# Patient Record
Sex: Female | Born: 1950 | Race: Black or African American | Hispanic: No | Marital: Married | Smoking: Never smoker
Health system: Southern US, Community
[De-identification: ages and names within clinical notes are randomized; demographics above are authoritative.]

## PROBLEM LIST (undated history)

## (undated) DIAGNOSIS — E785 Hyperlipidemia, unspecified: Secondary | ICD-10-CM

## (undated) DIAGNOSIS — B958 Unspecified staphylococcus as the cause of diseases classified elsewhere: Secondary | ICD-10-CM

## (undated) DIAGNOSIS — M7730 Calcaneal spur, unspecified foot: Secondary | ICD-10-CM

## (undated) DIAGNOSIS — D219 Benign neoplasm of connective and other soft tissue, unspecified: Secondary | ICD-10-CM

## (undated) HISTORY — DX: Benign neoplasm of connective and other soft tissue, unspecified: D21.9

## (undated) HISTORY — PX: HERNIA REPAIR: SHX51

## (undated) HISTORY — PX: EYE MUSCLE SURGERY: SHX370

## (undated) HISTORY — DX: Unspecified staphylococcus as the cause of diseases classified elsewhere: B95.8

## (undated) HISTORY — DX: Hyperlipidemia, unspecified: E78.5

## (undated) HISTORY — DX: Calcaneal spur, unspecified foot: M77.30

---

## 1984-06-17 HISTORY — PX: MYOMECTOMY ABDOMINAL APPROACH: SUR870

## 1994-06-17 HISTORY — PX: ABDOMINAL HYSTERECTOMY: SHX81

## 2000-02-28 ENCOUNTER — Other Ambulatory Visit: Admission: RE | Admit: 2000-02-28 | Discharge: 2000-02-28 | Payer: Self-pay | Admitting: Obstetrics and Gynecology

## 2001-04-16 ENCOUNTER — Other Ambulatory Visit: Admission: RE | Admit: 2001-04-16 | Discharge: 2001-04-16 | Payer: Self-pay | Admitting: Obstetrics and Gynecology

## 2002-09-28 ENCOUNTER — Other Ambulatory Visit: Admission: RE | Admit: 2002-09-28 | Discharge: 2002-09-28 | Payer: Self-pay | Admitting: Obstetrics and Gynecology

## 2004-06-15 ENCOUNTER — Other Ambulatory Visit: Admission: RE | Admit: 2004-06-15 | Discharge: 2004-06-15 | Payer: Self-pay | Admitting: Obstetrics and Gynecology

## 2005-08-16 ENCOUNTER — Other Ambulatory Visit: Admission: RE | Admit: 2005-08-16 | Discharge: 2005-08-16 | Payer: Self-pay | Admitting: Obstetrics and Gynecology

## 2005-08-29 LAB — HM COLONOSCOPY

## 2006-03-14 ENCOUNTER — Ambulatory Visit: Payer: Self-pay | Admitting: Family Medicine

## 2006-06-27 ENCOUNTER — Ambulatory Visit: Payer: Self-pay | Admitting: Family Medicine

## 2007-03-18 ENCOUNTER — Ambulatory Visit: Payer: Self-pay | Admitting: Family Medicine

## 2007-03-24 ENCOUNTER — Ambulatory Visit: Payer: Self-pay | Admitting: Family Medicine

## 2007-04-06 ENCOUNTER — Ambulatory Visit: Payer: Self-pay | Admitting: Family Medicine

## 2007-05-06 ENCOUNTER — Ambulatory Visit: Payer: Self-pay | Admitting: Family Medicine

## 2007-06-04 ENCOUNTER — Ambulatory Visit: Payer: Self-pay | Admitting: Family Medicine

## 2007-07-02 ENCOUNTER — Ambulatory Visit: Payer: Self-pay | Admitting: Family Medicine

## 2007-10-15 ENCOUNTER — Other Ambulatory Visit: Admission: RE | Admit: 2007-10-15 | Discharge: 2007-10-15 | Payer: Self-pay | Admitting: Addiction Medicine

## 2008-08-16 ENCOUNTER — Ambulatory Visit: Payer: Self-pay | Admitting: Obstetrics and Gynecology

## 2008-12-01 ENCOUNTER — Ambulatory Visit: Payer: Self-pay | Admitting: Family Medicine

## 2008-12-13 ENCOUNTER — Ambulatory Visit: Payer: Self-pay | Admitting: Obstetrics and Gynecology

## 2008-12-13 ENCOUNTER — Other Ambulatory Visit: Admission: RE | Admit: 2008-12-13 | Discharge: 2008-12-13 | Payer: Self-pay | Admitting: Obstetrics and Gynecology

## 2008-12-13 ENCOUNTER — Encounter: Payer: Self-pay | Admitting: Obstetrics and Gynecology

## 2009-01-09 ENCOUNTER — Ambulatory Visit: Payer: Self-pay | Admitting: Obstetrics and Gynecology

## 2009-07-27 ENCOUNTER — Ambulatory Visit: Payer: Self-pay | Admitting: Obstetrics and Gynecology

## 2009-09-19 ENCOUNTER — Ambulatory Visit: Payer: Self-pay | Admitting: Family Medicine

## 2009-11-17 ENCOUNTER — Ambulatory Visit: Payer: Self-pay | Admitting: Family Medicine

## 2009-12-22 ENCOUNTER — Ambulatory Visit: Payer: Self-pay | Admitting: Obstetrics and Gynecology

## 2009-12-22 ENCOUNTER — Other Ambulatory Visit: Admission: RE | Admit: 2009-12-22 | Discharge: 2009-12-22 | Payer: Self-pay | Admitting: Obstetrics and Gynecology

## 2009-12-25 LAB — HM PAP SMEAR: HM PAP: NEGATIVE

## 2010-01-29 ENCOUNTER — Ambulatory Visit: Payer: Self-pay | Admitting: Obstetrics and Gynecology

## 2010-06-17 DIAGNOSIS — B958 Unspecified staphylococcus as the cause of diseases classified elsewhere: Secondary | ICD-10-CM

## 2010-06-17 HISTORY — DX: Unspecified staphylococcus as the cause of diseases classified elsewhere: B95.8

## 2010-06-17 HISTORY — PX: OTHER SURGICAL HISTORY: SHX169

## 2010-07-30 ENCOUNTER — Other Ambulatory Visit: Payer: Self-pay

## 2010-07-30 DIAGNOSIS — E7800 Pure hypercholesterolemia, unspecified: Secondary | ICD-10-CM

## 2010-07-30 DIAGNOSIS — R7309 Other abnormal glucose: Secondary | ICD-10-CM

## 2010-11-22 ENCOUNTER — Ambulatory Visit: Payer: BC Managed Care – PPO | Admitting: Family Medicine

## 2010-12-03 ENCOUNTER — Ambulatory Visit (INDEPENDENT_AMBULATORY_CARE_PROVIDER_SITE_OTHER): Payer: BC Managed Care – PPO | Admitting: Family Medicine

## 2010-12-03 ENCOUNTER — Encounter: Payer: Self-pay | Admitting: Family Medicine

## 2010-12-03 DIAGNOSIS — R7301 Impaired fasting glucose: Secondary | ICD-10-CM | POA: Insufficient documentation

## 2010-12-03 DIAGNOSIS — Z79899 Other long term (current) drug therapy: Secondary | ICD-10-CM

## 2010-12-03 DIAGNOSIS — L509 Urticaria, unspecified: Secondary | ICD-10-CM

## 2010-12-03 DIAGNOSIS — L299 Pruritus, unspecified: Secondary | ICD-10-CM

## 2010-12-03 DIAGNOSIS — I10 Essential (primary) hypertension: Secondary | ICD-10-CM | POA: Insufficient documentation

## 2010-12-03 DIAGNOSIS — E7800 Pure hypercholesterolemia, unspecified: Secondary | ICD-10-CM

## 2010-12-03 LAB — COMPREHENSIVE METABOLIC PANEL
Albumin: 4 g/dL (ref 3.5–5.2)
BUN: 23 mg/dL (ref 6–23)
Calcium: 9.3 mg/dL (ref 8.4–10.5)
Chloride: 104 mEq/L (ref 96–112)
Creat: 0.85 mg/dL (ref 0.50–1.10)
Glucose, Bld: 87 mg/dL (ref 70–99)
Potassium: 4 mEq/L (ref 3.5–5.3)

## 2010-12-03 LAB — LIPID PANEL
HDL: 41 mg/dL (ref >39)
Total CHOL/HDL Ratio: 4.9 Ratio
Triglycerides: 56 mg/dL (ref <150)

## 2010-12-03 NOTE — Patient Instructions (Signed)
ZYRTEC (CETIRIZINE) 10MG  IS THE ANTIHISTAMINE THAT I RECOMMEND YOU TAKE.  TAKE IT ONCE DAILY--IF IT MAKES YOU SLEEPY, THEN TAKE IT AT BEDTIME. IF YOU FIND THAT YOU ARE STILL DROWSY FROM IT, THEN CHANGE TO CLARITIN.  YOU MAY STILL USE DIPHENHYDRAMINE (BENADRYL) IF NEEDED TO TREAT HIVES.  Avoid taking long, hot baths or showers, as this can dry out yours skin and contribute to itching.  Moisturize your skin regularly, drink plenty of water.  Consider using Sarna anti-itch lotion, or topical hydrocortisone creams, if needed for itching.  Hives (Urticaria) Hives (urticaria) are itchy, red, swollen patches on the skin. They may change size, shape, and location quickly and repeatedly. Hives that occur deeper in the skin can cause swelling of the hands, feet, and face. Hives may be an allergic reaction to something you or your child ate, touched, or put on the skin. Hives can also be a reaction to cold, heat, viral infections, medication, insect bites, or emotional stress. Often the cause is hard to find. Hives can come and go for several days to several weeks. Hives are not contagious. HOME CARE INSTRUCTIONS:  If the cause of the hives is known, avoid exposure to that source.   To relieve itching and rash:   Apply cold compresses to the skin or take cool water baths. Do not take or give your child hot baths or showers because the warmth will make the itching worse.   The best medicine for hives is an antihistamine. An antihistamine will not cure hives, but it will reduce their severity. You can use an antihistamine available over the counter, such as Benadryl. This medicine may make your child sleepy. Teenagers should not drive while using this medicine.   Take or give Benadryl every 6 hours until the hives are completely gone for 24 hours or as directed.   Your child may have other medications prescribed for itching. Give these as directed by your child's physician.   You or your child should wear  loose fitting clothing, including undergarments. Skin irritations may make hives worse.   Follow-up as provided by your caregiver.  SEEK MEDICAL CARE IF:  You or your child still have considerable itching after taking the medication (prescribed or purchased over the counter).   An oral temperature above 101 develops.   Joint swelling or pain occurs.  SEEK IMMEDIATE MEDICAL CARE IF:  Swollen lips or tongue are noticed.   There is difficulty with breathing, swallowing, or tightness in the throat or chest.   Abdominal (belly) pain develops.   Your child starts acting very sick.  These may be the first signs of a life-threatening allergic reaction. THIS IS AN EMERGENCY. Call 911 for medical help. MAKE SURE YOU:   Understand these instructions.   Will watch your condition.   Will get help right away if you are not doing well or get worse.  Document Released: 06/03/2005 Document Re-Released: 05/16/2008 [facility] Patient Information 2011 [place], [place].

## 2010-12-03 NOTE — Progress Notes (Signed)
Subjective:    Patient ID: Angel Ortega, female    DOB: 09-Sep-1950, 60 y.o.   MRN: [id number]  HPI Patient presents with complaints of itching.  Itchy "everywhere"--legs, elbows, arms.  Doesn't feel itchy all the time, but suddenly, something will feel itchy (ie a foot), and then she starts scratching, then develops welps and bumps, and continues to be itchy.  Symptoms have been going on for about 4 months.  Not getting worse, but not getting better.  Denies any change in products (soaps, etc.).  Tried stopping the fish oil for a week, but it didn't improve (hasn't re-started yet).  Denies any change in diet.  Takes long hot baths.  Has a rash under her left breast that comes and goes, slightly itchy, but worse once she starts scratching it.    Occasionally drinks regular sodas.  Has been being monitored by Dr. Eda Paschal for elevated sugars, but wasn't told she had diabetes.  Review of chart shows h/o elevated sugars (in impaired fasting glucose range).  Past Medical History  Diagnosis Date  . Hypertension   . Impaired fasting glucose     monitored by Dr. Eda Paschal  . Hyperlipidemia   . Allergy     Past Surgical History  Procedure Date  . Abdominal hysterectomy 1996    partial; for fibroids  . Eye muscle surgery child    L eye  . Hernia repair     History   Social History  . Marital Status: Married    Spouse Name: N/A    Number of Children: N/A  . Years of Education: N/A   Occupational History  . Not on file.   Social History Main Topics  . Smoking status: Never Smoker   . Smokeless tobacco: Never Used  . Alcohol Use: No  . Drug Use: No  . Sexually Active: Not on file   Other Topics Concern  . Not on file   Social History Narrative  . No narrative on file    Family History  Problem Relation Age of Onset  . Cancer Mother     lung cancer  . Diabetes Father   . Hypertension Father   . Diabetes Sister   . Cancer Brother     ?tumors in muscles  .  Hyperlipidemia Sister   . Hypertension Sister   . Hyperlipidemia Sister   . Hypertension Sister     Current outpatient prescriptions:aspirin 81 MG tablet, Take 81 mg by mouth 3 (three) times a week.  , Disp: , Rfl: ;  diphenhydrAMINE (BENADRYL) 25 MG tablet, Take 25 mg by mouth at bedtime.  , Disp: , Rfl: ;  lisinopril-hydrochlorothiazide (PRINZIDE,ZESTORETIC) 20-25 MG per tablet, Take 1 tablet by mouth daily.  , Disp: , Rfl: ;  fish oil-omega-3 fatty acids 1000 MG capsule, Take 1 g by mouth daily.  , Disp: , Rfl:   No Known Allergies  Review of Systems Denies fevers, cough, shortness of breath.  Occasional sneeze.  No chest pain, palpitations, or headaches.  Denies dizziness, swelling in feet.  Denies urinary complaints or bowel changes    Objective:   Physical Exam BP 132/82  Pulse 72  Ht 5\' 3"  (1.6 m)  Wt 200 lb (90.719 kg)  BMI 35.43 kg/m2 Pleasant female, scratching at inner thighs, in no distress HEENT: PERRL, EOMI, conjunctiva clear.  OP clear, normal lips/mouth Neck:  No thyromegaly or lymphadenopathy Heart: regular rate and rhythm Lungs: clear Skin:  Below skin fold under left breast is  a hyperpigmented area.  No raised/warmth/erythematous or any central clearing.  Not macerated.  Skin is mildly dry.  A few urticarial lesions noted (and warm to the touch) where she has recently been scratching (inner thighs). No dermatographism noted Extremities:  Trace edema Psych:  Normal mood, affect, hygiene and grooming        Assessment & Plan:   1. Urticaria  TSH  2. Pruritic disorder  TSH  3. Essential hypertension, benign  Comprehensive metabolic panel   controlled  4. Encounter for long-term (current) use of other medications  Comprehensive metabolic panel  5. Impaired fasting glucose  Hemoglobin A1c  6. Pure hypercholesterolemia  Lipid panel    Unclear etiology of pruritis--check labs.  May partially be related to dry skin.  Stop bubble baths.  Recommended Zyrtec daily,  and benadryl prn.  Discussed moisturizers and Sarna prn, also OTC hydrocortisone creams prn to focal areas (ie. Under breast)

## 2010-12-04 ENCOUNTER — Telehealth: Payer: Self-pay | Admitting: *Deleted

## 2010-12-04 NOTE — Telephone Encounter (Signed)
Left message for pt to call back to go over lab results.

## 2010-12-04 NOTE — Telephone Encounter (Signed)
Spoke with patient, she was given lab results. She stated that she will call back and get an OV to discuss results further with Dr.Lalonde.

## 2010-12-04 NOTE — Telephone Encounter (Signed)
Pt called and asked if I would fax her lab results to her GYN, Dr.Gottsegen. I faxed records to [phone]

## 2011-04-04 ENCOUNTER — Ambulatory Visit (INDEPENDENT_AMBULATORY_CARE_PROVIDER_SITE_OTHER): Payer: BC Managed Care – PPO | Admitting: Gynecology

## 2011-04-04 DIAGNOSIS — E7800 Pure hypercholesterolemia, unspecified: Secondary | ICD-10-CM

## 2011-04-04 DIAGNOSIS — R7309 Other abnormal glucose: Secondary | ICD-10-CM

## 2011-04-04 DIAGNOSIS — R739 Hyperglycemia, unspecified: Secondary | ICD-10-CM

## 2011-05-27 ENCOUNTER — Encounter: Payer: Self-pay | Admitting: Family Medicine

## 2011-05-27 ENCOUNTER — Ambulatory Visit (INDEPENDENT_AMBULATORY_CARE_PROVIDER_SITE_OTHER): Payer: BC Managed Care – PPO | Admitting: Family Medicine

## 2011-05-27 VITALS — BP 122/80 | HR 84 | Temp 98.2°F | Ht 63.0 in | Wt 193.0 lb

## 2011-05-27 DIAGNOSIS — L0291 Cutaneous abscess, unspecified: Secondary | ICD-10-CM

## 2011-05-27 DIAGNOSIS — L0390 Cellulitis, unspecified: Secondary | ICD-10-CM

## 2011-05-27 MED ORDER — DOXYCYCLINE HYCLATE 100 MG PO TABS: 100.0000 mg | ORAL_TABLET | Freq: Two times a day (BID) | ORAL | Status: DC

## 2011-05-27 NOTE — Progress Notes (Signed)
Patient presents with complaint of a boil on her right inner thigh.  First noticed the bump 4-5 days ago.  It got larger and more tender, and after soaking in a hot tub with epsom salts it ended up bursting.  It continues to drain --notices some Harsha with light red blood on her panties.  Continues to sting, but overall is less painful than prior to it draining.  Denies fevers.  Past Medical History  Diagnosis Date  . Hypertension   . Impaired fasting glucose     monitored by Dr. Eda Paschal  . Hyperlipidemia   . Allergy     Past Surgical History  Procedure Date  . Abdominal hysterectomy 1996    partial; for fibroids  . Eye muscle surgery child    L eye  . Hernia repair     History   Social History  . Marital Status: Married    Spouse Name: N/A    Number of Children: N/A  . Years of Education: N/A   Occupational History  . Not on file.   Social History Main Topics  . Smoking status: Never Smoker   . Smokeless tobacco: Never Used  . Alcohol Use: No  . Drug Use: No  . Sexually Active: Not on file   Other Topics Concern  . Not on file   Social History Narrative  . No narrative on file    Family History  Problem Relation Age of Onset  . Cancer Mother     lung cancer  . Diabetes Father   . Hypertension Father   . Diabetes Sister   . Cancer Brother     ?tumors in muscles  . Hyperlipidemia Sister   . Hypertension Sister   . Hyperlipidemia Sister   . Hypertension Sister    Current Outpatient Prescriptions on File Prior to Visit  Medication Sig Dispense Refill  . aspirin 81 MG tablet Take 81 mg by mouth 3 (three) times a week.        Marland Kitchen lisinopril-hydrochlorothiazide (PRINZIDE,ZESTORETIC) 20-25 MG per tablet Take 1 tablet by mouth daily.         No Known Allergies  ROS: Denies nausea, vomiting, other skin lesions or rashes (just pruritis on her arms, dry skin).  Denies fevers, URI symptoms, cough  PHYSICAL EXAM: BP 122/80  Pulse 84  Temp(Src) 98.2 F (36.8  C) (Oral)  Ht 5\' 3"  (1.6 m)  Wt 193 lb (87.544 kg)  BMI 34.19 kg/m2 Well developed, pleasant female in no distress R inner thigh: 9 cm indurated lesions right inner thigh with 3 different areas that have drained.  No significant active draining.  +hyperpigmentation of her dark skin extending beyond the indurated area  Procedure: Area was cleansed with betadine, anesthetized with 1% lidocaine with epi.  An 11 blade was then used to make incision over somewhat fluctuant portion of mass.  Large amounts of purulent material, some of which was thick and cheesy (ie. Sebaceous-appearing) was expressed from the wound.  Was packed lightly with iodiform gauze to keep wound edges open to allow for continued drainage.  Patient tolerated the procedure well  ASSESSMENT/PLAN: 1. Abscess  doxycycline (VIBRA-TABS) 100 MG tablet, Wound culture, PR Drain skin abscess, simple  Wound care instructions reviewed.  Hope to have gauze remain in for 1-2 days.  Once removed, restart warm soaks.  Take doxycycline as prescribed.  Wound culture sent.   F/u next week, sooner prn, for re-check

## 2011-05-27 NOTE — Patient Instructions (Signed)
Leave packing gauze in for 1-2 days, then you may remove it.  Continue to apply warm compresses/soaks.  Take antibiotics as prescribed.  Follow up in 1 week for re-check, sooner if needed   Abscess Care After An abscess (also called a boil or furuncle) is an infected area that contains a collection of pus. Signs and symptoms of an abscess include pain, tenderness, redness, or hardness, or you may feel a moveable soft area under your skin. An abscess can occur anywhere in the body. The infection may spread to surrounding tissues causing cellulitis. A cut (incision) by the surgeon was made over your abscess and the pus was drained out. Gauze may have been packed into the space to provide a drain that will allow the cavity to heal from the inside outwards. The boil may be painful for 5 to 7 days. Most people with a boil do not have high fevers. Your abscess, if seen early, may not have localized, and may not have been lanced. If not, another appointment may be required for this if it does not get better on its own or with medications. HOME CARE INSTRUCTIONS   Only take over-the-counter or prescription medicines for pain, discomfort, or fever as directed by your caregiver.   When you bathe, soak and then remove gauze or iodoform packs at least daily or as directed by your caregiver. You may then wash the wound gently with mild soapy water. Repack with gauze or do as your caregiver directs.  SEEK IMMEDIATE MEDICAL CARE IF:   You develop increased pain, swelling, redness, drainage, or bleeding in the wound site.   You develop signs of generalized infection including muscle aches, chills, fever, or a general ill feeling.   An oral temperature above 102 F (38.9 C) develops, not controlled by medication.  See your caregiver for a recheck if you develop any of the symptoms described above. If medications (antibiotics) were prescribed, take them as directed. Document Released: 12/20/2004 Document Revised:  02/13/2011 Document Reviewed: 08/17/2007 [facility] Patient Information 2012 [place], [place].

## 2011-05-30 LAB — WOUND CULTURE: Gram Stain: NONE SEEN

## 2011-06-05 ENCOUNTER — Ambulatory Visit (INDEPENDENT_AMBULATORY_CARE_PROVIDER_SITE_OTHER): Payer: BC Managed Care – PPO | Admitting: Family Medicine

## 2011-06-05 ENCOUNTER — Encounter: Payer: Self-pay | Admitting: Family Medicine

## 2011-06-05 VITALS — BP 130/74 | HR 72 | Ht 63.0 in | Wt 198.0 lb

## 2011-06-05 DIAGNOSIS — I10 Essential (primary) hypertension: Secondary | ICD-10-CM

## 2011-06-05 DIAGNOSIS — R7301 Impaired fasting glucose: Secondary | ICD-10-CM

## 2011-06-05 DIAGNOSIS — A4902 Methicillin resistant Staphylococcus aureus infection, unspecified site: Secondary | ICD-10-CM

## 2011-06-05 DIAGNOSIS — E7800 Pure hypercholesterolemia, unspecified: Secondary | ICD-10-CM

## 2011-06-05 MED ORDER — LISINOPRIL-HYDROCHLOROTHIAZIDE 20-25 MG PO TABS: 1.0000 | ORAL_TABLET | Freq: Every day | ORAL | Status: DC

## 2011-06-05 NOTE — Progress Notes (Signed)
Patient presents for f/u on abscess on R upper thigh, I&D's last week.  Culture grew MRSA, and sensitive to doxycycline.  She reports it is much better, no longer draining.  Swelling is much decreased, no fevers.  Has some itching of the skin on her hands, arms which is relieved by taking Zyrtec.  Denies any rashes  Requesting refill to [facility] of blood pressure medication.  Denies headaches, dizziness.  BP's at home run 120-130/75-80  Past Medical History  Diagnosis Date  . Hypertension   . Impaired fasting glucose     monitored by Dr. Eda Paschal  . Hyperlipidemia   . Allergy     Past Surgical History  Procedure Date  . Abdominal hysterectomy 1996    partial; for fibroids  . Eye muscle surgery child    L eye  . Hernia repair     History   Social History  . Marital Status: Married    Spouse Name: N/A    Number of Children: N/A  . Years of Education: N/A   Occupational History  . Not on file.   Social History Main Topics  . Smoking status: Never Smoker   . Smokeless tobacco: Never Used  . Alcohol Use: No  . Drug Use: No  . Sexually Active: Not on file   Other Topics Concern  . Not on file   Social History Narrative  . No narrative on file    Family History  Problem Relation Age of Onset  . Cancer Mother     lung cancer  . Diabetes Father   . Hypertension Father   . Diabetes Sister   . Cancer Brother     ?tumors in muscles  . Hyperlipidemia Sister   . Hypertension Sister   . Hyperlipidemia Sister   . Hypertension Sister    Current Outpatient Prescriptions on File Prior to Visit  Medication Sig Dispense Refill  . aspirin 81 MG tablet Take 81 mg by mouth 3 (three) times a week.        . cetirizine (ZYRTEC) 10 MG tablet Take 10 mg by mouth daily.        Marland Kitchen doxycycline (VIBRA-TABS) 100 MG tablet Take 1 tablet (100 mg total) by mouth 2 (two) times daily.  20 tablet  0   No Known Allergies  ROS:  Denies fevers, nausea, vomiting, diarrhea, new skin complaints,  dizziness, headaches, chest pain, edema, or other concerns.  PHYSICAL EXAM: BP 130/74  Pulse 72  Ht 5\' 3"  (1.6 m)  Wt 198 lb (89.812 kg)  BMI 35.07 kg/m2 Well developed, pleasant, obese female in no distress  R inner thigh-- still hyperpigmented, but no warmth.  No longer any induration.  There are two open wounds.  The inferiormost was where the I&D was last week.  Opening measures 3/4 cm, no active drainage or surrounding erythema.  Superior/medial lesion measures 1.5 cm, [place] center, no surrounding erythema. No drainage Extremities: no edema Heart: regular rate and rhythm Psych: normal mood, affect, hygiene and grooming Neuro: grossly normal cranial nerves, normal gait  ASSESSMENT/PLAN:  1. MRSA infection     resolved--complete ABX  2. Essential hypertension, benign  lisinopril-hydrochlorothiazide (PRINZIDE,ZESTORETIC) 20-25 MG per tablet, Comprehensive metabolic panel  3. Pure hypercholesterolemia  Lipid panel, Comprehensive metabolic panel  4. Impaired fasting glucose  Hemoglobin A1c    MRSA--improving s/p I&D and doxycycline.  Complete course of antibiotics.  Continue warm soapy soaks until skin is entirely healed HTN--well controlled.  Refill meds  Hyperlipidemia--results from  October from Dr. Eda Paschal was reviewed.  Low HDL (38), LDL 123, ratio 4.8.  Discussed daily exercise, and re-starting fish oil 3000-4000 mg daily.  Continue low cholesterol diet.  nonfasting sugar was 108

## 2011-06-05 NOTE — Patient Instructions (Addendum)
MRSA--improving after draining and doxycycline.  Complete course of antibiotics.  Continue warm soapy soaks until skin is entirely healed; also use antibacterial ointment to two open areas of skin until healed.  Return if increasing swelling, pain, drainage, fever, etc for re-evaluation   Community-Associated MRSA CA-MRSA stands for community-associated methicillin-resistant Staphylococcus aureus. MRSA is a type of bacteria that is resistant to some common antibiotics. It can cause infections in the skin and many other places in the body. Staphylococcus aureus, often called "staph," is a bacteria that normally lives on the skin or in the nose. Staph on the surface of the skin or in the nose does not cause problems. However, if the staph enters the body through a cut, wound, or break in the skin, an infection can happen. Up until recently, infections with the MRSA type of staph mainly occurred in hospitals and other healthcare settings. There are now increasing problems with MRSA infections in the community as well. Infections with MRSA may be very serious or even life-threatening. CA-MRSA is becoming more common. It is known to spread in crowded settings, in jails and prisons, and in situations where there is close skin-to-skin contact, such as during sporting events or in locker rooms. MRSA can be spread through shared items, such as children's toys, razors, towels, or sports equipment.  CAUSES All staph, including MRSA, are normally harmless unless they enter the body through a scratch, cut, or wound, such as with surgery. All staph, including MRSA, can be spread from person-to-person by touching contaminated objects or through direct contact.  MRSA now causes illness in people who have not been in hospitals or other healthcare facilities. Cases of MRSA diseases in the community have been associated with:   Recent antibiotic use.   Sharing contaminated towels or clothes.   Having active skin  diseases.   Participating in contact sports.   Living in crowded settings.   Intravenous (IV) drug use.   Community-associated MRSA infections are usually skin infections, but may cause other severe illnesses.   Staph bacteria are one of the most common causes of skin infection. However, they are also a common cause of pneumonia, bone or joint infections, and bloodstream infections.  DIAGNOSIS Diagnosis of MRSA is done by cultures of fluid samples that may come from:  Swabs taken from cuts or wounds in infected areas.   Nasal swabs.   Saliva or deep cough specimens from the lungs (sputum).   Urine.   Blood.  Many people are "colonized" with MRSA but have no signs of infection. This means that people carry the MRSA germ on their skin or in their nose and may never develop MRSA infection.  TREATMENT  Treatment varies and is based on how serious, how deep, or how extensive the infection is. For example:  Some skin infections, such as a small boil or abscess, may be treated by draining yellowish-Armbrust fluid (pus) from the site of the infection.   Deeper or more widespread soft tissue infections are usually treated with surgery to drain pus and with antibiotic medicine given by vein or by mouth. This may be recommended even if you are pregnant.   Serious infections may require a hospital stay.  If antibiotics are given, they may be needed for several weeks. PREVENTION Because many people are colonized with staph, including MRSA, preventing the spread of the bacteria from person-to-person is most important. The best way to prevent the spread of bacteria and other germs is through proper hand washing or  by using alcohol-based hand disinfectants. The following are other ways to help prevent MRSA infection within community settings.   Wash your hands frequently with soap and water for at least 15 seconds. Otherwise, use alcohol-based hand disinfectants when soap and water is not available.    Make sure people who live with you wash their hands often, too.   Do not share personal items. For example, avoid sharing razors and other personal hygiene items, towels, clothing, and athletic equipment.   Wash and dry your clothes and bedding at the warmest temperatures recommended on the labels.   Keep wounds covered. Pus from infected sores may contain MRSA and other bacteria. Keep cuts and abrasions clean and covered with germ-free (sterile), dry bandages until they are healed.   If you have a wound that appears infected, ask your caregiver if a culture for MRSA and other bacteria should be done.   If you are breastfeeding, talk to your caregiver about MRSA. You may be asked to temporarily stop breastfeeding.  HOME CARE INSTRUCTIONS   Take your antibiotics as directed. Finish them even if you start to feel better.   Avoid close contact with those around you as much as possible. Do not use towels, razors, toothbrushes, bedding, or other items that will be used by others.   To fight the infection, follow your caregiver's instructions for wound care. Wash your hands before and after changing your bandages.   If you have an intravascular device, such as a catheter, make sure you know how to care for it.   Be sure to tell any healthcare providers that you have MRSA so they are aware of your infection.  SEEK IMMEDIATE MEDICAL CARE IF:  The infection appears to be getting worse. Signs include:   Increased warmth, redness, or tenderness around the wound site.   A red line that extends from the infection site.   A dark color in the area around the infection.   Wound drainage that is tan, yellow, or green.   A bad smell coming from the wound.   You feel sick to your stomach (nauseous) and throw up (vomit) or cannot keep medicine down.   You have a fever.   Your baby is older than 3 months with a rectal temperature of 102 F (38.9 C) or higher.   Your baby is 33 months old or  younger with a rectal temperature of 100.4 F (38 C) or higher.   You have difficulty breathing.  MAKE SURE YOU:   Understand these instructions.   Will watch your condition.   Will get help right away if you are not doing well or get worse.  Document Released: 09/06/2005 Document Revised: 02/13/2011 Document Reviewed: 09/06/2010 [facility] Patient Information 2012 [place], [place].  Please re-start fish oil capsules (goal of 3000-4000 mg daily). Continue to exercise daily and follow low cholesterol diet

## 2011-07-22 ENCOUNTER — Encounter: Payer: Self-pay | Admitting: Obstetrics and Gynecology

## 2011-07-22 ENCOUNTER — Encounter: Payer: Self-pay | Admitting: Internal Medicine

## 2011-07-22 LAB — HM MAMMOGRAPHY: HM Mammogram: NEGATIVE

## 2011-08-13 DIAGNOSIS — M7730 Calcaneal spur, unspecified foot: Secondary | ICD-10-CM | POA: Insufficient documentation

## 2011-08-13 DIAGNOSIS — D219 Benign neoplasm of connective and other soft tissue, unspecified: Secondary | ICD-10-CM | POA: Insufficient documentation

## 2011-08-22 ENCOUNTER — Ambulatory Visit (INDEPENDENT_AMBULATORY_CARE_PROVIDER_SITE_OTHER): Payer: BC Managed Care – PPO | Admitting: Obstetrics and Gynecology

## 2011-08-22 ENCOUNTER — Encounter: Payer: Self-pay | Admitting: Obstetrics and Gynecology

## 2011-08-22 VITALS — BP 138/80 | Ht 63.0 in | Wt 204.0 lb

## 2011-08-22 DIAGNOSIS — Z01419 Encounter for gynecological examination (general) (routine) without abnormal findings: Secondary | ICD-10-CM

## 2011-08-22 NOTE — Progress Notes (Signed)
Patient came to see me today for her annual GYN exam. She is doing well without hot flashes. She does have a normal mammogram. She is now do her lab through her PCP. There also watching her hemoglobin A1c. She is watching her diet. We will recheck it in June. She has had a normal bone density. She is having no vaginal bleeding. She is having no pelvic pain. She gets periodic right shoulder pain without limitation of motion or sensory changes. Her PCP has her on Tylenol arthritis.  HEENT: Within normal limits. Kennon Portela present. Neck: No masses. Supraclavicular lymph nodes: Not enlarged. Breasts: Examined in both sitting and lying position. Symmetrical without skin changes or masses. Abdomen: Soft no masses guarding or rebound. No hernias. Pelvic: External within normal limits. BUS within normal limits. Vaginal examination shows good estrogen effect, no cystocele enterocele or rectocele. Cervix and uterus absent. Adnexa within normal limits. Rectovaginal confirmatory. Extremities within normal limits.

## 2011-08-23 LAB — URINALYSIS W MICROSCOPIC + REFLEX CULTURE
Glucose, UA: NEGATIVE mg/dL
Hgb urine dipstick: NEGATIVE
Nitrite: NEGATIVE
Protein, ur: NEGATIVE mg/dL

## 2011-08-24 LAB — URINE CULTURE: Colony Count: 10000

## 2011-12-18 ENCOUNTER — Telehealth: Payer: Self-pay | Admitting: Family Medicine

## 2011-12-18 ENCOUNTER — Other Ambulatory Visit: Payer: Self-pay | Admitting: *Deleted

## 2011-12-18 DIAGNOSIS — I10 Essential (primary) hypertension: Secondary | ICD-10-CM

## 2011-12-18 MED ORDER — LISINOPRIL-HYDROCHLOROTHIAZIDE 20-25 MG PO TABS: 1.0000 | ORAL_TABLET | Freq: Every day | ORAL | Status: DC

## 2011-12-18 NOTE — Telephone Encounter (Signed)
Called patient and left message for her to return my call and schedule med check with labs prior with Dr.Knapp (was due ~12/04/11) before I can refill her meds.

## 2011-12-18 NOTE — Telephone Encounter (Signed)
Done

## 2011-12-24 ENCOUNTER — Encounter: Payer: Self-pay | Admitting: Family Medicine

## 2011-12-24 ENCOUNTER — Ambulatory Visit (INDEPENDENT_AMBULATORY_CARE_PROVIDER_SITE_OTHER): Payer: BC Managed Care – PPO | Admitting: Family Medicine

## 2011-12-24 VITALS — BP 130/78 | HR 91 | Wt 205.0 lb

## 2011-12-24 DIAGNOSIS — I10 Essential (primary) hypertension: Secondary | ICD-10-CM

## 2011-12-24 DIAGNOSIS — Z79899 Other long term (current) drug therapy: Secondary | ICD-10-CM

## 2011-12-24 DIAGNOSIS — R7301 Impaired fasting glucose: Secondary | ICD-10-CM

## 2011-12-24 DIAGNOSIS — J309 Allergic rhinitis, unspecified: Secondary | ICD-10-CM

## 2011-12-24 DIAGNOSIS — E669 Obesity, unspecified: Secondary | ICD-10-CM | POA: Insufficient documentation

## 2011-12-24 DIAGNOSIS — J302 Other seasonal allergic rhinitis: Secondary | ICD-10-CM | POA: Insufficient documentation

## 2011-12-24 LAB — COMPREHENSIVE METABOLIC PANEL
ALT: 13 U/L (ref 0–35)
AST: 13 U/L (ref 0–37)
CO2: 30 mEq/L (ref 19–32)
Calcium: 9.2 mg/dL (ref 8.4–10.5)
Chloride: 104 mEq/L (ref 96–112)
Creat: 0.84 mg/dL (ref 0.50–1.10)
Potassium: 4 mEq/L (ref 3.5–5.3)
Sodium: 143 mEq/L (ref 135–145)
Total Protein: 6.5 g/dL (ref 6.0–8.3)

## 2011-12-24 LAB — CBC WITH DIFFERENTIAL/PLATELET
Basophils Absolute: 0 10*3/uL (ref 0.0–0.1)
Lymphocytes Relative: 36 % (ref 12–46)
Lymphs Abs: 1.5 10*3/uL (ref 0.7–4.0)
MCV: 87.6 fL (ref 78.0–100.0)
Neutro Abs: 2.2 10*3/uL (ref 1.7–7.7)
Neutrophils Relative %: 53 % (ref 43–77)
Platelets: 227 10*3/uL (ref 150–400)
RBC: 4.03 MIL/uL (ref 3.87–5.11)
RDW: 14.6 % (ref 11.5–15.5)
WBC: 4.2 10*3/uL (ref 4.0–10.5)

## 2011-12-24 LAB — LIPID PANEL
Cholesterol: 197 mg/dL (ref 0–200)
Total CHOL/HDL Ratio: 5.8 Ratio

## 2011-12-24 NOTE — Progress Notes (Signed)
  Subjective:    Patient ID: STARLYNN KLINKNER, female    DOB: 03-19-1951, 61 y.o.   MRN: [id number]  HPI She is here for recheck. She continues on medications listed in the chart and is having no difficulty with them. She does have a history of impaired fasting glucose. Her exercise is quite minimal. She blames this on some recent knee pain. It was difficult to get a good history from her but on she complains of pain with motion but states that at this time it is getting better. Her allergies are under good control.   Review of Systems     Objective:   Physical Exam alert and in no distress. Tympanic membranes and canals are normal. Throat is clear. Tonsils are normal. Neck is supple without adenopathy or thyromegaly. Cardiac exam shows a regular sinus rhythm without murmurs or gallops. Lungs are clear to auscultation.        Assessment & Plan:   1. Essential hypertension, benign  CBC with Differential  2. Impaired fasting glucose  Comprehensive metabolic panel  3. Obesity (BMI 30-39.9)  Lipid panel  4. Encounter for long-term (current) use of other medications  CBC with Differential, Comprehensive metabolic panel, Lipid panel  5. Allergic rhinitis, seasonal     encouraged her to become more physically active. No particular therapy for the knee since this is getting better. Encouraged her to make dietary changes. She will return here as needed.

## 2012-01-09 ENCOUNTER — Ambulatory Visit (INDEPENDENT_AMBULATORY_CARE_PROVIDER_SITE_OTHER): Payer: BC Managed Care – PPO | Admitting: Family Medicine

## 2012-01-09 ENCOUNTER — Encounter: Payer: Self-pay | Admitting: Family Medicine

## 2012-01-09 VITALS — BP 130/82 | HR 72 | Temp 97.6°F | Ht 63.0 in | Wt 202.0 lb

## 2012-01-09 DIAGNOSIS — A4902 Methicillin resistant Staphylococcus aureus infection, unspecified site: Secondary | ICD-10-CM

## 2012-01-09 MED ORDER — DOXYCYCLINE HYCLATE 100 MG PO TABS: 100.0000 mg | ORAL_TABLET | Freq: Two times a day (BID) | ORAL | Status: AC

## 2012-01-09 MED ORDER — MUPIROCIN 2 % EX OINT: TOPICAL_OINTMENT | CUTANEOUS | Status: DC

## 2012-01-09 NOTE — Progress Notes (Signed)
Chief Complaint  Patient presents with  . Rash    has had MRSA in the past, has noticed some bumps and would like you to check them out and see if they could possibly be MRSA. Various places-left thigh, around belly button and back of right thigh near buttock. Also wonders if she could just be having a reaction to fish oil?   HPI: About a week ago she started noticing some sore bumps--2 on her left thigh, 1 on her R buttock, 1 near her umbilicus.  She also had one near her vagina that burst last week.  Lump resolved, but some residual swelling.  Denies any fevers. H/o large abscess in November that required I&D, that cultured positive for MRSA. Has had no recurrent problems until recently.  Denies any contacts with skin conditions or MRSA.  Past Medical History  Diagnosis Date  . Hypertension   . Impaired fasting glucose     monitored by Dr. Eda Paschal  . Hyperlipidemia   . Allergy   . Fibroid   . Heel spur   . Staph infection 2012   Past Surgical History  Procedure Date  . Eye muscle surgery child    L eye  . Hernia repair   . Abdominal hysterectomy 1996    TAH/FIBROIDS  . Myomectomy abdominal approach 1986  . Staph infection 2012    Right thigh   History   Social History  . Marital Status: Married    Spouse Name: N/A    Number of Children: N/A  . Years of Education: N/A   Occupational History  . Not on file.   Social History Main Topics  . Smoking status: Never Smoker   . Smokeless tobacco: Never Used  . Alcohol Use: No  . Drug Use: No  . Sexually Active: Yes    Birth Control/ Protection: Surgical   Other Topics Concern  . Not on file   Social History Narrative  . No narrative on file   Current Outpatient Prescriptions on File Prior to Visit  Medication Sig Dispense Refill  . Acetaminophen (TYLENOL ARTHRITIS PAIN PO) Take by mouth.      Marland Kitchen aspirin 81 MG tablet Take 81 mg by mouth 3 (three) times a week.        . cetirizine (ZYRTEC) 10 MG tablet Take 10 mg by  mouth daily.        Marland Kitchen lisinopril-hydrochlorothiazide (PRINZIDE,ZESTORETIC) 20-25 MG per tablet Take 1 tablet by mouth daily.  90 tablet  0  . Omega-3 Fatty Acids (FISH OIL PO) Take 2 capsules by mouth daily.       Recently also started taking a MVI with iron.  No Known Allergies  ROS: Denies fevers, nausea, vomiting, headaches, dizziness. Sometimes also gets a rash between her thigh, gets dark, sometimes itchy.  PHYSICAL EXAM: BP 130/82  Pulse 72  Temp 97.6 F (36.4 C) (Oral)  Ht 5\' 3"  (1.6 m)  Wt 202 lb (91.627 kg)  BMI 35.78 kg/m2 Well developed, pleasant female in no distress Right thigh--very small abscess proximally (about 1cm in size, no fluctuance, pustule present).  Just below this is a healing lesion--dried/flaky, less tender.  Similar lesions on her posterior thigh and above umbilicus (although that one appears slightly ulcerated)   ASSESSMENT/PLAN: 1. MRSA (methicillin resistant Staphylococcus aureus)  doxycycline (VIBRA-TABS) 100 MG tablet, mupirocin ointment (BACTROBAN) 2 %   Recurrent MRSA.  Treat with doxycycline, but will also treat as a presumed carrier, given recurrence and extent of infection  now.  Will treat with nasal mupirocin to anterior nares BID x 5-7 days, and a one-time application of chlorhexidine.  May use mupirocin in future prn for early recurrences.

## 2012-01-09 NOTE — Patient Instructions (Addendum)
The bumps you have appear consistent with recurrent MRSA.  Given the number that you have, and that this isn't the first time, it is likely that you are a carrier.  We are going to treat you as a carrier (carrying the bacteria in your nose).  Use chlorhexidine topically (2-4%) applied as a body wash from neck down, as a single application (use this just once).  Use the mupirocin ointment to the front part of the nostrils twice daily for 5-7 days.    Take the antibiotic twice daily as directed.  You may use the mupirocin three times daily, in the future, if you get any of these "lumps" coming back, as this might treat it effectively and avoid it turning into a larger abscess or needing oral antibiotics.  Apply warm compresses to the lesion on your thigh--this likely will drain a little pus in the next few days.  Community-Associated MRSA CA-MRSA stands for community-associated methicillin-resistant Staphylococcus aureus. MRSA is a type of bacteria that is resistant to some common antibiotics. It can cause infections in the skin and many other places in the body. Staphylococcus aureus, often called "staph," is a bacteria that normally lives on the skin or in the nose. Staph on the surface of the skin or in the nose does not cause problems. However, if the staph enters the body through a cut, wound, or break in the skin, an infection can happen. Up until recently, infections with the MRSA type of staph mainly occurred in hospitals and other healthcare settings. There are now increasing problems with MRSA infections in the community as well. Infections with MRSA may be very serious or even life-threatening. CA-MRSA is becoming more common. It is known to spread in crowded settings, in jails and prisons, and in situations where there is close skin-to-skin contact, such as during sporting events or in locker rooms. MRSA can be spread through shared items, such as children's toys, razors, towels, or sports  equipment.  CAUSES All staph, including MRSA, are normally harmless unless they enter the body through a scratch, cut, or wound, such as with surgery. All staph, including MRSA, can be spread from person-to-person by touching contaminated objects or through direct contact.  MRSA now causes illness in people who have not been in hospitals or other healthcare facilities. Cases of MRSA diseases in the community have been associated with:   Recent antibiotic use.   Sharing contaminated towels or clothes.   Having active skin diseases.   Participating in contact sports.   Living in crowded settings.   Intravenous (IV) drug use.   Community-associated MRSA infections are usually skin infections, but may cause other severe illnesses.   Staph bacteria are one of the most common causes of skin infection. However, they are also a common cause of pneumonia, bone or joint infections, and bloodstream infections.  DIAGNOSIS Diagnosis of MRSA is done by cultures of fluid samples that may come from:  Swabs taken from cuts or wounds in infected areas.   Nasal swabs.   Saliva or deep cough specimens from the lungs (sputum).   Urine.   Blood.  Many people are "colonized" with MRSA but have no signs of infection. This means that people carry the MRSA germ on their skin or in their nose and may never develop MRSA infection.  TREATMENT  Treatment varies and is based on how serious, how deep, or how extensive the infection is. For example:  Some skin infections, such as a small boil or  abscess, may be treated by draining yellowish-Aslinger fluid (pus) from the site of the infection.   Deeper or more widespread soft tissue infections are usually treated with surgery to drain pus and with antibiotic medicine given by vein or by mouth. This may be recommended even if you are pregnant.   Serious infections may require a hospital stay.  If antibiotics are given, they may be needed for several  weeks. PREVENTION Because many people are colonized with staph, including MRSA, preventing the spread of the bacteria from person-to-person is most important. The best way to prevent the spread of bacteria and other germs is through proper hand washing or by using alcohol-based hand disinfectants. The following are other ways to help prevent MRSA infection within community settings.   Wash your hands frequently with soap and water for at least 15 seconds. Otherwise, use alcohol-based hand disinfectants when soap and water is not available.   Make sure people who live with you wash their hands often, too.   Do not share personal items. For example, avoid sharing razors and other personal hygiene items, towels, clothing, and athletic equipment.   Wash and dry your clothes and bedding at the warmest temperatures recommended on the labels.   Keep wounds covered. Pus from infected sores may contain MRSA and other bacteria. Keep cuts and abrasions clean and covered with germ-free (sterile), dry bandages until they are healed.   If you have a wound that appears infected, ask your caregiver if a culture for MRSA and other bacteria should be done.   If you are breastfeeding, talk to your caregiver about MRSA. You may be asked to temporarily stop breastfeeding.  HOME CARE INSTRUCTIONS   Take your antibiotics as directed. Finish them even if you start to feel better.   Avoid close contact with those around you as much as possible. Do not use towels, razors, toothbrushes, bedding, or other items that will be used by others.   To fight the infection, follow your caregiver's instructions for wound care. Wash your hands before and after changing your bandages.   If you have an intravascular device, such as a catheter, make sure you know how to care for it.   Be sure to tell any healthcare providers that you have MRSA so they are aware of your infection.  SEEK IMMEDIATE MEDICAL CARE IF:  The infection  appears to be getting worse. Signs include:   Increased warmth, redness, or tenderness around the wound site.   A red line that extends from the infection site.   A dark color in the area around the infection.   Wound drainage that is tan, yellow, or green.   A bad smell coming from the wound.   You feel sick to your stomach (nauseous) and throw up (vomit) or cannot keep medicine down.   You have a fever.   Your baby is older than 3 months with a rectal temperature of 102 F (38.9 C) or higher.   Your baby is 39 months old or younger with a rectal temperature of 100.4 F (38 C) or higher.   You have difficulty breathing.  MAKE SURE YOU:   Understand these instructions.   Will watch your condition.   Will get help right away if you are not doing well or get worse.  Document Released: 09/06/2005 Document Revised: 05/23/2011 Document Reviewed: 09/06/2010 [facility] Patient Information 2012 [place], [place].

## 2012-03-09 ENCOUNTER — Telehealth: Payer: Self-pay | Admitting: Family Medicine

## 2012-03-09 ENCOUNTER — Other Ambulatory Visit: Payer: Self-pay | Admitting: Medical

## 2012-03-09 DIAGNOSIS — I10 Essential (primary) hypertension: Secondary | ICD-10-CM

## 2012-03-09 MED ORDER — LISINOPRIL-HYDROCHLOROTHIAZIDE 20-25 MG PO TABS: 1.0000 | ORAL_TABLET | Freq: Every day | ORAL | Status: DC

## 2012-03-10 NOTE — Telephone Encounter (Signed)
Done

## 2012-09-01 ENCOUNTER — Telehealth: Payer: Self-pay | Admitting: Family Medicine

## 2012-09-01 NOTE — Telephone Encounter (Signed)
CALLED PT TO INFORM HER SHE NEEDS APPOINTMENT WE CAN MAKE REFERRAL BUT SHE NEEDS TO COME IN PER JCL

## 2012-09-08 ENCOUNTER — Telehealth: Payer: Self-pay | Admitting: Family Medicine

## 2012-09-08 DIAGNOSIS — I10 Essential (primary) hypertension: Secondary | ICD-10-CM

## 2012-09-08 MED ORDER — LISINOPRIL-HYDROCHLOROTHIAZIDE 20-25 MG PO TABS: 1.0000 | ORAL_TABLET | Freq: Every day | ORAL | Status: DC

## 2012-09-08 NOTE — Telephone Encounter (Signed)
SENT MED IN 

## 2012-09-10 ENCOUNTER — Encounter: Payer: Self-pay | Admitting: Women's Health

## 2012-09-10 ENCOUNTER — Encounter: Payer: Self-pay | Admitting: Obstetrics and Gynecology

## 2012-09-10 ENCOUNTER — Ambulatory Visit (INDEPENDENT_AMBULATORY_CARE_PROVIDER_SITE_OTHER): Payer: BC Managed Care – PPO | Admitting: Women's Health

## 2012-09-10 VITALS — BP 128/86 | Ht 63.0 in | Wt 208.0 lb

## 2012-09-10 DIAGNOSIS — Z01419 Encounter for gynecological examination (general) (routine) without abnormal findings: Secondary | ICD-10-CM

## 2012-09-10 DIAGNOSIS — Z780 Asymptomatic menopausal state: Secondary | ICD-10-CM

## 2012-09-10 NOTE — Patient Instructions (Signed)

## 2012-09-10 NOTE — Progress Notes (Signed)
Angel Ortega 12/02/1950 [id number]    History:    The patient presents for annual exam.  TAH in 58 for fibroids, myomectomy 84. History of normal mammograms and Paps. Normal bone density in 2010 T score AP spine 1.5, bilateral hip average 1.1. Hypertension-labs and meds primary care. 1 benign polyp with colonoscopy in 2012.   Past medical history, past surgical history, family history and social history were all reviewed and documented in the EPIC chart. Retired Production designer, theatre/television/film. History of MRSA. Father, sisters diabetes and hypertension   ROS:  A  ROS was performed and pertinent positives and negatives are included in the history.  Exam:  Filed Vitals:   09/10/12 0948  BP: 128/86    General appearance:  Normal Head/Neck:  Normal, without cervical or supraclavicular adenopathy. Thyroid:  Symmetrical, normal in size, without palpable masses or nodularity. Respiratory  Effort:  Normal  Auscultation:  Clear without wheezing or rhonchi Cardiovascular  Auscultation:  Regular rate, without rubs, murmurs or gallops  Edema/varicosities:  Not grossly evident Abdominal  Soft,nontender, without masses, guarding or rebound.  Liver/spleen:  No organomegaly noted  Hernia:  None appreciated  Skin  Inspection:  Grossly normal  Palpation:  Grossly normal Neurologic/psychiatric  Orientation:  Normal with appropriate conversation.  Mood/affect:  Normal  Genitourinary    Breasts: Examined lying and sitting.     Right: Without masses, retractions, discharge or axillary adenopathy.     Left: Without masses, retractions, discharge or axillary adenopathy.   Inguinal/mons:  Normal without inguinal adenopathy  External genitalia:  Normal  BUS/Urethra/Skene's glands:  Normal  Bladder:  Normal  Vagina:  Normal  Cervix:  absent Uterus: absent  Adnexa/parametria:     Rt: Without masses or tenderness.   Lt: Without masses or tenderness.  Anus and perineum: Normal  Digital rectal  exam: Normal sphincter tone without palpated masses or tenderness  Assessment/Plan:  62 y.o. MBF G0  for annual exam with no complaints.  TAH/fibroids-normal Pap history Normal DEXA 2011 Benign polyp 2012 Hypertension/hypercholesterolemia-primary care labs and meds  Plan: Home Hemoccult card given with instructions. SBE's, continue annual mammogram, calcium rich diet, vitamin D 2000 daily encouraged. Reviewed importance of increasing regular exercise and decreasing calories for weight loss. Pap screening guidelines reviewed. Repeat DEXA this year.    Harrington Challenger WHNP, 11:10 AM 09/10/2012

## 2012-09-18 ENCOUNTER — Encounter: Payer: Self-pay | Admitting: Internal Medicine

## 2012-10-01 ENCOUNTER — Other Ambulatory Visit: Payer: Self-pay | Admitting: Anesthesiology

## 2012-10-01 DIAGNOSIS — Z1211 Encounter for screening for malignant neoplasm of colon: Secondary | ICD-10-CM

## 2012-12-16 ENCOUNTER — Telehealth: Payer: Self-pay | Admitting: Family Medicine

## 2012-12-16 DIAGNOSIS — I10 Essential (primary) hypertension: Secondary | ICD-10-CM

## 2012-12-16 MED ORDER — LISINOPRIL-HYDROCHLOROTHIAZIDE 20-25 MG PO TABS: 1.0000 | ORAL_TABLET | Freq: Every day | ORAL | Status: DC

## 2012-12-16 NOTE — Telephone Encounter (Signed)
PT NEEDS REFILL ON BP MEDS SENT TO [facility]. PT WAS INFORMED THAT IT HAD BEEN A WHILE SINCE A WELL VISIT AND PT DECLINED TO MAKE ONE AND ASKED THAT THIS REQUESTED BE SENT BACK.

## 2012-12-16 NOTE — Telephone Encounter (Signed)
Called patient and left message informing patient that she is due for med check appt with Dr.Lalonde, last was 12/24/11. I did let her know that I would call in this rx but I need her to call and schedule med check ASAP.

## 2012-12-29 ENCOUNTER — Ambulatory Visit (INDEPENDENT_AMBULATORY_CARE_PROVIDER_SITE_OTHER): Payer: BC Managed Care – PPO | Admitting: Family Medicine

## 2012-12-29 ENCOUNTER — Encounter: Payer: Self-pay | Admitting: Family Medicine

## 2012-12-29 VITALS — BP 114/76 | HR 70 | Wt 208.0 lb

## 2012-12-29 DIAGNOSIS — R7301 Impaired fasting glucose: Secondary | ICD-10-CM

## 2012-12-29 DIAGNOSIS — J309 Allergic rhinitis, unspecified: Secondary | ICD-10-CM

## 2012-12-29 DIAGNOSIS — I10 Essential (primary) hypertension: Secondary | ICD-10-CM

## 2012-12-29 DIAGNOSIS — J302 Other seasonal allergic rhinitis: Secondary | ICD-10-CM

## 2012-12-29 DIAGNOSIS — Z79899 Other long term (current) drug therapy: Secondary | ICD-10-CM

## 2012-12-29 DIAGNOSIS — E669 Obesity, unspecified: Secondary | ICD-10-CM

## 2012-12-29 LAB — COMPREHENSIVE METABOLIC PANEL
ALT: 16 U/L (ref 0–35)
BUN: 29 mg/dL — ABNORMAL HIGH (ref 6–23)
CO2: 28 mEq/L (ref 19–32)
Calcium: 9.4 mg/dL (ref 8.4–10.5)
Chloride: 106 mEq/L (ref 96–112)
Creat: 0.96 mg/dL (ref 0.50–1.10)
Total Bilirubin: 0.7 mg/dL (ref 0.3–1.2)

## 2012-12-29 LAB — CBC WITH DIFFERENTIAL/PLATELET
Eosinophils Absolute: 0.2 10*3/uL (ref 0.0–0.7)
Eosinophils Relative: 5 % (ref 0–5)
HCT: 37.1 % (ref 36.0–46.0)
Lymphs Abs: 1.5 10*3/uL (ref 0.7–4.0)
MCH: 28.7 pg (ref 26.0–34.0)
MCV: 86.7 fL (ref 78.0–100.0)
Monocytes Absolute: 0.4 10*3/uL (ref 0.1–1.0)
Platelets: 201 10*3/uL (ref 150–400)
RBC: 4.28 MIL/uL (ref 3.87–5.11)
RDW: 14.4 % (ref 11.5–15.5)

## 2012-12-29 LAB — LIPID PANEL
Cholesterol: 201 mg/dL — ABNORMAL HIGH (ref 0–200)
HDL: 35 mg/dL — ABNORMAL LOW (ref >39)
Total CHOL/HDL Ratio: 5.7 Ratio
Triglycerides: 84 mg/dL (ref <150)
VLDL: 17 mg/dL (ref 0–40)

## 2012-12-29 MED ORDER — LISINOPRIL-HYDROCHLOROTHIAZIDE 20-25 MG PO TABS: 1.0000 | ORAL_TABLET | Freq: Every day | ORAL | Status: DC

## 2012-12-29 NOTE — Progress Notes (Signed)
  Subjective:    Patient ID: Angel Ortega, female    DOB: 1950-09-07, 62 y.o.   MRN: [id number]  HPI She is here for medication check. She continues on her blood pressure medication and is having no difficulty with this. Her allergies seem to be under good control. She does have a previous history of impaired fasting glucose. She has no other concerns or complaints. Social history was reviewed. She has a very sedentary lifestyle.  Review of Systems     Objective:   Physical Exam alert and in no distress. Tympanic membranes and canals are normal. Throat is clear. Tonsils are normal. Neck is supple without adenopathy or thyromegaly. Cardiac exam shows a regular sinus rhythm without murmurs or gallops. Lungs are clear to auscultation.        Assessment & Plan:  Allergic rhinitis, seasonal  Essential hypertension, benign - Plan: CBC with Differential, Comprehensive metabolic panel  Impaired fasting glucose  Obesity (BMI 30-39.9)  Encounter for long-term (current) use of other medications - Plan: CBC with Differential, Comprehensive metabolic panel, Lipid panel  encouraged her to become more physically active.

## 2012-12-31 NOTE — Progress Notes (Signed)
Quick Note:  SENT DIET INFO WITH LETTER OF LABS ______

## 2013-09-15 ENCOUNTER — Telehealth: Payer: Self-pay | Admitting: Family Medicine

## 2013-09-15 DIAGNOSIS — I10 Essential (primary) hypertension: Secondary | ICD-10-CM

## 2013-09-15 MED ORDER — LISINOPRIL-HYDROCHLOROTHIAZIDE 20-25 MG PO TABS: 1.0000 | ORAL_TABLET | Freq: Every day | ORAL | Status: DC

## 2013-09-15 NOTE — Telephone Encounter (Signed)
Medication sent in to [facility] 

## 2013-09-30 LAB — HM MAMMOGRAPHY

## 2013-10-01 ENCOUNTER — Encounter: Payer: Self-pay | Admitting: Women's Health

## 2013-10-05 ENCOUNTER — Encounter: Payer: Self-pay | Admitting: Women's Health

## 2013-10-05 ENCOUNTER — Ambulatory Visit (INDEPENDENT_AMBULATORY_CARE_PROVIDER_SITE_OTHER): Payer: BC Managed Care – PPO | Admitting: Women's Health

## 2013-10-05 VITALS — BP 134/88 | Ht 63.0 in | Wt 212.6 lb

## 2013-10-05 DIAGNOSIS — Z01419 Encounter for gynecological examination (general) (routine) without abnormal findings: Secondary | ICD-10-CM

## 2013-10-05 MED ORDER — NYSTATIN-TRIAMCINOLONE 100000-0.1 UNIT/GM-% EX OINT: 1.0000 "application " | TOPICAL_OINTMENT | Freq: Two times a day (BID) | CUTANEOUS | Status: DC

## 2013-10-05 NOTE — Patient Instructions (Signed)
Health Recommendations for Postmenopausal Women Respected and ongoing research has looked at the most common causes of death, disability, and poor quality of life in postmenopausal women. The causes include heart disease, diseases of blood vessels, diabetes, depression, cancer, and bone loss (osteoporosis). Many things can be done to help lower the chances of developing these and other common problems: CARDIOVASCULAR DISEASE Heart Disease: A heart attack is a medical emergency. Know the signs and symptoms of a heart attack. Below are things women can do to reduce their risk for heart disease.   Do not smoke. If you smoke, quit.  Aim for a healthy weight. Being overweight causes many preventable deaths. Eat a healthy and balanced diet and drink an adequate amount of liquids.  Get moving. Make a commitment to be more physically active. Aim for 30 minutes of activity on most, if not all days of the week.  Eat for heart health. Choose a diet that is low in saturated fat and cholesterol and eliminate trans fat. Include whole grains, vegetables, and fruits. Read and understand the labels on food containers before buying.  Know your numbers. Ask your caregiver to check your blood pressure, cholesterol (total, HDL, LDL, triglycerides) and blood glucose. Work with your caregiver on improving your entire clinical picture.  High blood pressure. Limit or stop your table salt intake (try salt substitute and food seasonings). Avoid salty foods and drinks. Read labels on food containers before buying. Eating well and exercising can help control high blood pressure. STROKE  Stroke is a medical emergency. Stroke may be the result of a blood clot in a blood vessel in the brain or by a brain hemorrhage (bleeding). Know the signs and symptoms of a stroke. To lower the risk of developing a stroke:  Avoid fatty foods.  Quit smoking.  Control your diabetes, blood pressure, and irregular heart rate. THROMBOPHLEBITIS  (BLOOD CLOT) OF THE LEG  Becoming overweight and leading a stationary lifestyle may also contribute to developing blood clots. Controlling your diet and exercising will help lower the risk of developing blood clots. CANCER SCREENING  Breast Cancer: Take steps to reduce your risk of breast cancer.  You should practice "breast self-awareness." This means understanding the normal appearance and feel of your breasts and should include breast self-examination. Any changes detected, no matter how small, should be reported to your caregiver.  After age 40, you should have a clinical breast exam (CBE) every year.  Starting at age 40, you should consider having a mammogram (breast X-ray) every year.  If you have a family history of breast cancer, talk to your caregiver about genetic screening.  If you are at high risk for breast cancer, talk to your caregiver about having an MRI and a mammogram every year.  Intestinal or Stomach Cancer: Tests to consider are a rectal exam, fecal occult blood, sigmoidoscopy, and colonoscopy. Women who are high risk may need to be screened at an earlier age and more often.  Cervical Cancer:  Beginning at age 30, you should have a Pap test every 3 years as long as the past 3 Pap tests have been normal.  If you have had past treatment for cervical cancer or a condition that could lead to cancer, you need Pap tests and screening for cancer for at least 20 years after your treatment.  If you had a hysterectomy for a problem that was not cancer or a condition that could lead to cancer, then you no longer need Pap tests.    If you are between ages 65 and 70, and you have had normal Pap tests going back 10 years, you no longer need Pap tests.  If Pap tests have been discontinued, risk factors (such as a new sexual partner) need to be reassessed to determine if screening should be resumed.  Some medical problems can increase the chance of getting cervical cancer. In these  cases, your caregiver may recommend more frequent screening and Pap tests.  Uterine Cancer: If you have vaginal bleeding after reaching menopause, you should notify your caregiver.  Ovarian cancer: Other than yearly pelvic exams, there are no reliable tests available to screen for ovarian cancer at this time except for yearly pelvic exams.  Lung Cancer: Yearly chest X-rays can detect lung cancer and should be done on high risk women, such as cigarette smokers and women with chronic lung disease (emphysema).  Skin Cancer: A complete body skin exam should be done at your yearly examination. Avoid overexposure to the sun and ultraviolet light lamps. Use a strong sun block cream when in the sun. All of these things are important in lowering the risk of skin cancer. MENOPAUSE Menopause Symptoms: Hormone therapy products are effective for treating symptoms associated with menopause:  Moderate to severe hot flashes.  Night sweats.  Mood swings.  Headaches.  Tiredness.  Loss of sex drive.  Insomnia.  Other symptoms. Hormone replacement carries certain risks, especially in older women. Women who use or are thinking about using estrogen or estrogen with progestin treatments should discuss that with their caregiver. Your caregiver will help you understand the benefits and risks. The ideal dose of hormone replacement therapy is not known. The Food and Drug Administration (FDA) has concluded that hormone therapy should be used only at the lowest doses and for the shortest amount of time to reach treatment goals.  OSTEOPOROSIS Protecting Against Bone Loss and Preventing Fracture: If you use hormone therapy for prevention of bone loss (osteoporosis), the risks for bone loss must outweigh the risk of the therapy. Ask your caregiver about other medications known to be safe and effective for preventing bone loss and fractures. To guard against bone loss or fractures, the following is recommended:  If  you are less than age 50, take 1000 mg of calcium and at least 600 mg of Vitamin D per day.  If you are greater than age 50 but less than age 70, take 1200 mg of calcium and at least 600 mg of Vitamin D per day.  If you are greater than age 70, take 1200 mg of calcium and at least 800 mg of Vitamin D per day. Smoking and excessive alcohol intake increases the risk of osteoporosis. Eat foods rich in calcium and vitamin D and do weight bearing exercises several times a week as your caregiver suggests. DIABETES Diabetes Melitus: If you have Type I or Type 2 diabetes, you should keep your blood sugar under control with diet, exercise and recommended medication. Avoid too many sweets, starchy and fatty foods. Being overweight can make control more difficult. COGNITION AND MEMORY Cognition and Memory: Menopausal hormone therapy is not recommended for the prevention of cognitive disorders such as Alzheimer's disease or memory loss.  DEPRESSION  Depression may occur at any age, but is common in elderly women. The reasons may be because of physical, medical, social (loneliness), or financial problems and needs. If you are experiencing depression because of medical problems and control of symptoms, talk to your caregiver about this. Physical activity and   exercise may help with mood and sleep. Community and volunteer involvement may help your sense of value and worth. If you have depression and you feel that the problem is getting worse or becoming severe, talk to your caregiver about treatment options that are best for you. ACCIDENTS  Accidents are common and can be serious in the elderly woman. Prepare your house to prevent accidents. Eliminate throw rugs, place hand bars in the bath, shower and toilet areas. Avoid wearing high heeled shoes or walking on wet, snowy, and icy areas. Limit or stop driving if you have vision or hearing problems, or you feel you are unsteady with you movements and  reflexes. HEPATITIS C Hepatitis C is a type of viral infection affecting the liver. It is spread mainly through contact with blood from an infected person. It can be treated, but if left untreated, it can lead to severe liver damage over years. Many people who are infected do not know that the virus is in their blood. If you are a "baby-boomer", it is recommended that you have one screening test for Hepatitis C. IMMUNIZATIONS  Several immunizations are important to consider having during your senior years, including:   Tetanus, diptheria, and pertussis booster shot.  Influenza every year before the flu season begins.  Pneumonia vaccine.  Shingles vaccine.  Others as indicated based on your specific needs. Talk to your caregiver about these. Document Released: 07/26/2005 Document Revised: 05/20/2012 Document Reviewed: 03/21/2008 [employer] Patient Information 2014 [employer].  

## 2013-10-05 NOTE — Progress Notes (Signed)
Angel Ortega 29-May-1951 [id number]    History:    Presents for annual exam.  TAH for fibroids on no HRT. Normal Pap and mammogram history. 2010 DEXA T score hip average 1.1. 2002 benign colon polyp,  normal colonoscopy 2007. Hypertension/hypercholesterolemia primary care manages. Current on vaccines.  Past medical history, past surgical history, family history and social history were all reviewed and documented in the EPIC chart. Retired Publishing rights manager. Mother lung cancer, father, sister diabetes and hypertension.  ROS:  A 12 POINT  ROS was performed and pertinent positives and negatives are included.  Exam:  Filed Vitals:   10/05/13 0943  BP: 134/88    General appearance:  Normal Thyroid:  Symmetrical, normal in size, without palpable masses or nodularity. Respiratory  Auscultation:  Clear without wheezing or rhonchi Cardiovascular  Auscultation:  Regular rate, without rubs, murmurs or gallops  Edema/varicosities:  Not grossly evident Abdominal  Soft,nontender, without masses, guarding or rebound.  Liver/spleen:  No organomegaly noted  Hernia:  None appreciated  Skin  Inspection:  Grossly normal   Breasts: Examined lying and sitting.     Right: Without masses, retractions, discharge or axillary adenopathy.     Left: Without masses, retractions, discharge or axillary adenopathy. Gentitourinary   Inguinal/mons:  Normal without inguinal adenopathy  External genitalia:  Normal  BUS/Urethra/Skene's glands:  Normal  Vagina:  Normal  Cervix: Absent  Uterus: Absent  Adnexa/parametria:     Rt: Without masses or tenderness.   Lt: Without masses or tenderness.  Anus and perineum: Normal  Digital rectal exam: Normal sphincter tone without palpated masses or tenderness  Assessment/Plan:  63 y.o.MBF G0  for annual exam with occasional itching upper inner thighs.  1996 TAH for fibroids Hypertension/hypercholesterolemia primary care manages labs and meds Obesity  Plan:  Schedule 10 year colonoscopy, SBE's, continue annual mammogram, calcium rich diet, vitamin D 2000 daily encouraged. Reviewed importance of regular exercise and decreasing calories for weight loss. UA. Small amount of Mycolog twice daily for itching upper inner thighs as needed/ no  rash today.    [place], 5:32 PM 10/05/2013

## 2013-10-06 ENCOUNTER — Encounter: Payer: Self-pay | Admitting: Family Medicine

## 2013-10-07 ENCOUNTER — Encounter: Payer: Self-pay | Admitting: Internal Medicine

## 2013-11-26 ENCOUNTER — Telehealth: Payer: Self-pay | Admitting: Internal Medicine

## 2013-11-26 NOTE — Telephone Encounter (Signed)
Refill request came in for lisinopril-hctz but pt has not been seen in almost a year. This was denied and pt will have to come in for appt first before anymore refill

## 2013-11-29 ENCOUNTER — Telehealth: Payer: Self-pay | Admitting: Family Medicine

## 2013-11-29 DIAGNOSIS — I10 Essential (primary) hypertension: Secondary | ICD-10-CM

## 2013-11-29 MED ORDER — LISINOPRIL-HYDROCHLOROTHIAZIDE 20-25 MG PO TABS: 1.0000 | ORAL_TABLET | Freq: Every day | ORAL | Status: DC

## 2013-11-29 NOTE — Telephone Encounter (Signed)
Medication sent in. 

## 2013-11-29 NOTE — Telephone Encounter (Signed)
lm

## 2013-12-07 ENCOUNTER — Encounter: Payer: BC Managed Care – PPO | Admitting: Family Medicine

## 2013-12-08 MED ORDER — LISINOPRIL-HYDROCHLOROTHIAZIDE 20-25 MG PO TABS: 1.0000 | ORAL_TABLET | Freq: Every day | ORAL | Status: DC

## 2013-12-08 NOTE — Telephone Encounter (Signed)
Pt called and states that med has been sent in [facility] and it was SUPPOSE to go to [facility]. Pt called here and i did it on the phone for her sending new rx to [facility]

## 2013-12-13 ENCOUNTER — Ambulatory Visit (INDEPENDENT_AMBULATORY_CARE_PROVIDER_SITE_OTHER): Payer: Medicare Other | Admitting: Family Medicine

## 2013-12-13 ENCOUNTER — Encounter: Payer: Self-pay | Admitting: Family Medicine

## 2013-12-13 VITALS — BP 130/88 | HR 70 | Wt 207.0 lb

## 2013-12-13 DIAGNOSIS — I10 Essential (primary) hypertension: Secondary | ICD-10-CM

## 2013-12-13 DIAGNOSIS — J309 Allergic rhinitis, unspecified: Secondary | ICD-10-CM

## 2013-12-13 DIAGNOSIS — R7301 Impaired fasting glucose: Secondary | ICD-10-CM

## 2013-12-13 DIAGNOSIS — Z79899 Other long term (current) drug therapy: Secondary | ICD-10-CM

## 2013-12-13 DIAGNOSIS — J302 Other seasonal allergic rhinitis: Secondary | ICD-10-CM

## 2013-12-13 DIAGNOSIS — E669 Obesity, unspecified: Secondary | ICD-10-CM

## 2013-12-13 DIAGNOSIS — Z23 Encounter for immunization: Secondary | ICD-10-CM

## 2013-12-13 LAB — CBC WITH DIFFERENTIAL/PLATELET
Basophils Absolute: 0 10*3/uL (ref 0.0–0.1)
Basophils Relative: 0 % (ref 0–1)
EOS PCT: 3 % (ref 0–5)
Eosinophils Absolute: 0.1 10*3/uL (ref 0.0–0.7)
HEMATOCRIT: 37.4 % (ref 36.0–46.0)
HEMOGLOBIN: 12.3 g/dL (ref 12.0–15.0)
LYMPHS ABS: 1.4 10*3/uL (ref 0.7–4.0)
Lymphocytes Relative: 35 % (ref 12–46)
MCH: 28.6 pg (ref 26.0–34.0)
MCHC: 32.9 g/dL (ref 30.0–36.0)
MCV: 87 fL (ref 78.0–100.0)
MONOS PCT: 7 % (ref 3–12)
Monocytes Absolute: 0.3 10*3/uL (ref 0.1–1.0)
NEUTROS PCT: 55 % (ref 43–77)
Neutro Abs: 2.3 10*3/uL (ref 1.7–7.7)
Platelets: 192 10*3/uL (ref 150–400)
RBC: 4.3 MIL/uL (ref 3.87–5.11)
RDW: 14.6 % (ref 11.5–15.5)
WBC: 4.1 10*3/uL (ref 4.0–10.5)

## 2013-12-13 LAB — COMPREHENSIVE METABOLIC PANEL
ALT: 17 U/L (ref 0–35)
AST: 17 U/L (ref 0–37)
Albumin: 3.8 g/dL (ref 3.5–5.2)
Alkaline Phosphatase: 56 U/L (ref 39–117)
BUN: 17 mg/dL (ref 6–23)
CO2: 28 meq/L (ref 19–32)
Calcium: 9 mg/dL (ref 8.4–10.5)
Chloride: 104 mEq/L (ref 96–112)
Creat: 0.71 mg/dL (ref 0.50–1.10)
GLUCOSE: 96 mg/dL (ref 70–99)
Potassium: 3.9 mEq/L (ref 3.5–5.3)
SODIUM: 141 meq/L (ref 135–145)
TOTAL PROTEIN: 6.5 g/dL (ref 6.0–8.3)
Total Bilirubin: 0.5 mg/dL (ref 0.2–1.2)

## 2013-12-13 LAB — LIPID PANEL
CHOLESTEROL: 183 mg/dL (ref 0–200)
HDL: 37 mg/dL — ABNORMAL LOW (ref >39)
LDL Cholesterol: 130 mg/dL — ABNORMAL HIGH (ref 0–99)
TRIGLYCERIDES: 82 mg/dL (ref <150)
Total CHOL/HDL Ratio: 4.9 Ratio
VLDL: 16 mg/dL (ref 0–40)

## 2013-12-13 MED ORDER — LISINOPRIL-HYDROCHLOROTHIAZIDE 20-25 MG PO TABS: 1.0000 | ORAL_TABLET | Freq: Every day | ORAL | Status: DC

## 2013-12-13 NOTE — Progress Notes (Signed)
   Subjective:    Patient ID: Angel Ortega, female    DOB: 18-Feb-1951, 63 y.o.   MRN: [id number]  HPI She is here for a medication check. She does have a history of hypertension, allergies and impaired fasting glucose. She continues on medications listed in the chart. She has no particular concerns or complaints. She is exercising daily with walking. Her allergies are under good control. Her immunizations were reviewed.   Review of Systems     Objective:   Physical Exam alert and in no distress. Tympanic membranes and canals are normal. Throat is clear. Tonsils are normal. Neck is supple without adenopathy or thyromegaly. Cardiac exam shows a regular sinus rhythm without murmurs or gallops. Lungs are clear to auscultation.        Assessment & Plan:  Encounter for long-term (current) use of other medications - Plan: Pneumococcal conjugate vaccine 13-valent, CBC with Differential, Comprehensive metabolic panel, Lipid panel  Allergic rhinitis, seasonal  Essential hypertension, benign - Plan: lisinopril-hydrochlorothiazide (PRINZIDE,ZESTORETIC) 20-25 MG per tablet, CBC with Differential, Comprehensive metabolic panel, Lipid panel  Obesity (BMI 30-39.9) - Plan: CBC with Differential, Comprehensive metabolic panel, Lipid panel  Impaired fasting glucose - Plan: CBC with Differential, Comprehensive metabolic panel, Lipid panel  I encouraged her to continue with her active lifestyle.

## 2014-02-09 ENCOUNTER — Ambulatory Visit (INDEPENDENT_AMBULATORY_CARE_PROVIDER_SITE_OTHER): Payer: Medicare Other | Admitting: Family Medicine

## 2014-02-09 ENCOUNTER — Encounter: Payer: Self-pay | Admitting: Family Medicine

## 2014-02-09 VITALS — BP 142/94 | HR 76 | Temp 97.3°F | Ht 63.0 in | Wt 210.0 lb

## 2014-02-09 DIAGNOSIS — K529 Noninfective gastroenteritis and colitis, unspecified: Secondary | ICD-10-CM

## 2014-02-09 DIAGNOSIS — R42 Dizziness and giddiness: Secondary | ICD-10-CM

## 2014-02-09 DIAGNOSIS — J309 Allergic rhinitis, unspecified: Secondary | ICD-10-CM

## 2014-02-09 DIAGNOSIS — K5289 Other specified noninfective gastroenteritis and colitis: Secondary | ICD-10-CM

## 2014-02-09 DIAGNOSIS — I10 Essential (primary) hypertension: Secondary | ICD-10-CM

## 2014-02-09 NOTE — Progress Notes (Signed)
Chief Complaint  Patient presents with  . Emesis    started last Tuesday with a "dizzy, spinning HA." She did vomit Tuesday. Womders if she could have food poisoning. Still doesn't feel 100%, has a strange taste in her mouth. Wants to make sure everything is ok.    Last week she had head spinning with position changes--with sitting up and lying down, and also with some side to side movements.  She did not have any cold symptoms.  She vomited once and had diarrhea x a few episodes just for one day (last Tuesday).  She has had no further diarrhea, no longer vomiting or feeling nauseated, but still doesn't feel quite right. Describes it more like her equilibrium is off, denies any light headedness or presyncope. She had eaten at [employer] earlier that day (mac and cheese, beef liver and turnip greens), and wonders if it could have been food poisoning.  Her husband ate different foods.  Denies any sick contacts. No recent travel.  Appetite has been slowly improving, and she has been drinking gatorade and ginger ale, along with water.  Allergies are not flaring, controlled with daily zyrtec.  Blood pressures have been okay (lower than here at the office).    Denies fevers, chills, headaches, cough, shortness of breath or chest pain.  Once she noted a funny taste in her throat, slightly sore to swallow--that was short-lived/resolved.    Past Medical History  Diagnosis Date  . Hypertension   . Impaired fasting glucose     monitored by Dr. Cherylann Banas  . Hyperlipidemia   . Allergy   . Fibroid   . Heel spur   . Staph infection 2012   Past Surgical History  Procedure Laterality Date  . Eye muscle surgery  child    L eye  . Hernia repair    . Abdominal hysterectomy  1996    TAH/FIBROIDS  . Myomectomy abdominal approach  1986  . Staph infection  2012    Right thigh   History   Social History  . Marital Status: Married    Spouse Name: N/A    Number of Children: N/A  . Years of Education: N/A    Occupational History  . Not on file.   Social History Main Topics  . Smoking status: Never Smoker   . Smokeless tobacco: Never Used  . Alcohol Use: No  . Drug Use: No  . Sexual Activity: Yes    Birth Control/ Protection: Surgical   Other Topics Concern  . Not on file   Social History Narrative  . No narrative on file   Outpatient Encounter Prescriptions as of 02/09/2014  Medication Sig  . aspirin 81 MG tablet Take 81 mg by mouth 3 (three) times a week.    . cetirizine (ZYRTEC) 10 MG tablet Take 10 mg by mouth daily.    Marland Kitchen lisinopril-hydrochlorothiazide (PRINZIDE,ZESTORETIC) 20-25 MG per tablet Take 1 tablet by mouth daily.  . Multiple Vitamins-Minerals (MULTIVITAMIN WITH MINERALS) tablet Take 1 tablet by mouth daily.  . Omega-3 Fatty Acids (FISH OIL PO) Take 2 capsules by mouth daily.   . Acetaminophen (TYLENOL ARTHRITIS PAIN PO) Take by mouth.   No Known Allergies  ROS: no fevers, chills.  +nausea.  Vomiting/diarrhea resolved.  Denies congestion, URI/allergy symptoms. Denies sore throat, cough, shortness of breath, chest pain, edema, skin rash, bleeding, bruising. See HPI.  PHYSICAL EXAM: BP 142/94  Pulse 76  Temp(Src) 97.3 F (36.3 C) (Tympanic)  Ht 5\' 3"  (1.6 m)  Wt 210 lb (95.255 kg)  BMI 37.21 kg/m2 Orthostatic VS done--notable for 10 point change from lying to sitting (unchanged from sitting to standing); pulse increased from 60 sitting to 76 standing (was 64 lying). Well developed, very pleasant female in no distress HEENT:  PERRL, conjunctiva is clear.  She has a left lazy eye, but can focus with each eye individually, and track.  Nasal mucosa is moderately edematous, pale.  No erythema or purulence. Sinuses are nontender.  There is slight effusion on the left, otherwise TM's and EAC's are normal.  OP is clear, moist mucus membranes Neck: no lymphadenopathy, thyromegaly or carotid bruit Heart: regular rate and rhythm, no murmur Lungs: clear bilaterally Neuro:  cranial nerves intact (esotropia as noted above).  Normal finger to nose, gait, strength, sensation and DTR's. No nystagmus or vertigo developed with positional changes. Psych: normal mood, affect, hygiene and grooming   ASSESSMENT/PLAN:   Vertigo - mild, resolving. normal neuro exam  Essential hypertension, benign  Allergic rhinitis, cause unspecified - minimally symptomatic, but exam shows significant swelling.  Add flonase if increasing symptoms.  Acute gastroenteritis - mild, resolved.  likely viral   Continue to drink plenty of fluids. Continue zyrtec. If dizziness recurs/gets worse, consider use of meclizine as needed (this is over-the-counter and helps with vertigo).  If your allergies get worse, not well controlled with the zyrtec, then add a nasal steroid spray daily, such as Flonase (this is now over-the-counter).  Return if worsening dizziness, or any new symptoms develop. Continue to monitor your blood pressure at home (it was a little high today).

## 2014-02-09 NOTE — Patient Instructions (Signed)
  Continue to drink plenty of fluids. Continue zyrtec. If dizziness recurs/gets worse, consider use of meclizine as needed (this is over-the-counter and helps with vertigo).  If your allergies get worse, not well controlled with the zyrtec, then add a nasal steroid spray daily, such as Flonase (this is now over-the-counter).  Return if worsening dizziness, or any new symptoms develop. Continue to monitor your blood pressure at home (it was a little high today).

## 2014-05-11 ENCOUNTER — Ambulatory Visit (INDEPENDENT_AMBULATORY_CARE_PROVIDER_SITE_OTHER): Payer: Medicare Other | Admitting: Family Medicine

## 2014-05-11 ENCOUNTER — Encounter: Payer: Self-pay | Admitting: Family Medicine

## 2014-05-11 VITALS — BP 150/90 | HR 68 | Temp 96.6°F | Ht 63.0 in | Wt 209.0 lb

## 2014-05-11 DIAGNOSIS — Z23 Encounter for immunization: Secondary | ICD-10-CM

## 2014-05-11 DIAGNOSIS — B369 Superficial mycosis, unspecified: Secondary | ICD-10-CM

## 2014-05-11 DIAGNOSIS — L304 Erythema intertrigo: Secondary | ICD-10-CM

## 2014-05-11 NOTE — Patient Instructions (Signed)
You have a fungal infection of the skin, related to moisture.  It needs to be treated with regular use of antifungal cream for 2-3 weeks, until completely resolved. I recommend over-the-counter medications such as clotrimazole or lamisil.  Use these twice daily to the affected areas of skin--you can apply it under the breasts as well as to the thighs.  I do not recommend using the prescription cream that you have--the steroid is a little too strong.  You can use it as needed for severe itching, but use the over-the-counter antifungal.  Try and keep these areas dry.  Use cotton bra and powder to help absorb excess moisture.  Keep checking your blood pressure elsewhere, and follow up with Dr. Redmond School sooner if it is consistently over 140/90.  Intertrigo Intertrigo is a skin condition that occurs in between folds of skin in places on the body that rub together a lot and do not get much ventilation. It is caused by heat, moisture, friction, sweat retention, and lack of air circulation, which produces red, irritated patches and, sometimes, scaling or drainage. People who have diabetes, who are obese, or who have treatment with antibiotics are at increased risk for intertrigo. The most common sites for intertrigo to occur include:  The groin.  The breasts.  The armpits.  Folds of abdominal skin.  Webbed spaces between the fingers or toes. Intertrigo may be aggravated by:  Sweat.  Feces.  Yeast or bacteria that are present near skin folds.  Urine.  Vaginal discharge. HOME CARE INSTRUCTIONS  The following steps can be taken to reduce friction and keep the affected area cool and dry:  Expose skin folds to the air.  Keep deep skin folds separated with cotton or linen cloth. Avoid tight fitting clothing that could cause chafing.  Wear open-toed shoes or sandals to help reduce moisture between the toes.  Apply absorbent powders to affected areas as directed by your caregiver.  Apply  over-the-counter barrier pastes, such as zinc oxide, as directed by your caregiver.  If you develop a fungal infection in the affected area, your caregiver may have you use antifungal creams. SEEK MEDICAL CARE IF:   The rash is not improving after 1 week of treatment.  The rash is getting worse (more red, more swollen, more painful, or spreading).  You have a fever or chills. MAKE SURE YOU:   Understand these instructions.  Will watch your condition.  Will get help right away if you are not doing well or get worse. Document Released: 06/03/2005 Document Revised: 08/26/2011 Document Reviewed: 11/16/2009 [facility] Patient Information 2015 [place], [place]. This information is not intended to replace advice given to you by your health care provider. Make sure you discuss any questions you have with your health care provider.

## 2014-05-11 NOTE — Progress Notes (Signed)
Chief Complaint  Patient presents with  . Rash    on both thighs since probably about the summer. Itchy sometimes. Patient declined flu vaccine, states that she will skip this year and get next year.   She has had a rash on both thighs for quite a while.  It gets irritated after bathing, itches some. It seems to itch more after she scratches it.  She has used nystatin/Triamcinolone cream, but only uses it very infrequently--about once a month, only when it is very itchy.  Medication was filled in May, and still has a refill on it.  She has a similar problem under breasts, with rash, itching  PMH, PSH, SH reviewed  Outpatient Encounter Prescriptions as of 05/11/2014  Medication Sig  . aspirin 81 MG tablet Take 81 mg by mouth 3 (three) times a week.    . cetirizine (ZYRTEC) 10 MG tablet Take 10 mg by mouth daily.    Marland Kitchen lisinopril-hydrochlorothiazide (PRINZIDE,ZESTORETIC) 20-25 MG per tablet Take 1 tablet by mouth daily.  . Multiple Vitamins-Minerals (MULTIVITAMIN WITH MINERALS) tablet Take 1 tablet by mouth daily.  . Omega-3 Fatty Acids (FISH OIL PO) Take 2 capsules by mouth daily.   . Acetaminophen (TYLENOL ARTHRITIS PAIN PO) Take by mouth.   No Known Allergies  ROS:no fever, chills, URI symptoms, headache, chest pain, GI or GU complaints   PHYSICAL EXAM: BP 150/90 mmHg  Pulse 68  Temp(Src) 96.6 F (35.9 C) (Tympanic)  Ht 5\' 3"  (1.6 m)  Wt 209 lb (94.802 kg)  BMI 37.03 kg/m2 Well developed, pleasant female in no distress. She has large area on both inner thighs that are hyperpigmented. There is no central clearing.  The edges are somewhat raised and rough/flaky.  There is no erythema, warmth, drainage or induration.  The area involved is both inner thighs, in a kissing distribution.  She also has some hyperpigmentation and papules below her breasts.  No skin maceration.  ASSESSMENT/PLAN:  Fungal infection of skin  Intertrigo  Need for prophylactic vaccination and inoculation  against influenza - Plan: Flu vaccine HIGH DOSE PF (Fluzone Tri High dose)   You have a fungal infection of the skin, related to moisture.  It needs to be treated with regular use of antifungal cream for 2-3 weeks, until completely resolved. I recommend over-the-counter medications such as clotrimazole or lamisil.  Use these twice daily to the affected areas of skin--you can apply it under the breasts as well as to the thighs.  I do not recommend using the prescription cream that you have--the steroid is a little too strong.  You can use it as needed for severe itching, but use the over-the-counter antifungal.  Try and keep these areas dry.  Use cotton bra and powder to help absorb excess moisture.  Keep checking your blood pressure elsewhere, and follow up with Dr. Redmond School sooner if it is consistently over 140/90.  Counseled re: risks/benefits of flu vaccine, and agreed to get today.  F/u at routine med check with Dr. Redmond School, sooner prn

## 2014-10-03 LAB — HM MAMMOGRAPHY

## 2014-10-04 ENCOUNTER — Encounter: Payer: Self-pay | Admitting: Internal Medicine

## 2014-10-12 ENCOUNTER — Encounter: Payer: Self-pay | Admitting: Women's Health

## 2014-10-12 ENCOUNTER — Ambulatory Visit (INDEPENDENT_AMBULATORY_CARE_PROVIDER_SITE_OTHER): Payer: Medicare Other | Admitting: Women's Health

## 2014-10-12 VITALS — BP 120/82 | Ht 63.0 in | Wt 215.2 lb

## 2014-10-12 DIAGNOSIS — D259 Leiomyoma of uterus, unspecified: Secondary | ICD-10-CM | POA: Diagnosis not present

## 2014-10-12 DIAGNOSIS — Z01419 Encounter for gynecological examination (general) (routine) without abnormal findings: Secondary | ICD-10-CM | POA: Diagnosis not present

## 2014-10-12 DIAGNOSIS — Z13820 Encounter for screening for osteoporosis: Secondary | ICD-10-CM | POA: Diagnosis not present

## 2014-10-12 NOTE — Progress Notes (Signed)
Angel Ortega Angel Ortega [id number]    History:    Presents for breast and pelvic exam. TAH for fibroids. Normal Pap and mammogram history. 2011 Benign  breast biopsy. 2010 DEXA T score  +1.1 and hip. Hypertension and hypercholesterolemia managed by primary care. Current on vaccines. 2002  benign colon polyp, 2007 normal colonoscopy.  Past medical history, past surgical history, family history and social history were all reviewed and documented in the EPIC chart. Retired Teacher, adult education. Mother died of lung cancer. Father, sister diabetes and hypertension.  ROS:  A ROS was performed and pertinent positives and negatives are included.  Exam:  Filed Vitals:   10/12/14 1001  BP: 120/82    General appearance:  Normal Thyroid:  Symmetrical, normal in size, without palpable masses or nodularity. Respiratory  Auscultation:  Clear without wheezing or rhonchi Cardiovascular  Auscultation:  Regular rate, without rubs, murmurs or gallops  Edema/varicosities:  Not grossly evident Abdominal  Soft,nontender, without masses, guarding or rebound.  Liver/spleen:  No organomegaly noted  Hernia:  None appreciated  Skin  Inspection:  Grossly normal   Breasts: Examined lying and sitting.     Right: Without masses, retractions, discharge or axillary adenopathy.     Left: Without masses, retractions, discharge or axillary adenopathy. Gentitourinary   Inguinal/mons:  Normal without inguinal adenopathy  External genitalia:  Normal  BUS/Urethra/Skene's glands:  Normal  Vagina:  Normal  Cervix:  Absent  Uterus:  Absent  Adnexa/parametria:     Rt: Without masses or tenderness.   Lt: Without masses or tenderness.  Anus and perineum: Normal  Digital rectal exam: Normal sphincter tone without palpated masses or tenderness  Assessment/Plan:  64 y.o. MBF G0 for breast and pelvic exam with no complaints.   TAH for fibroids no HRT Hypertension/hypercholesterolemia primary care manages labs and  meds Obesity  Plan: Reviewed importance of increasing regular exercise and decreasing calories for weight loss. Instructed to have vitamin D level checked at next [facility] office appointment, if normal vitamin D 1000 daily encouraged. Home safety, fall prevention discussed. Repeat DEXA will schedule. SBE's, continue annual screening mammogram, 3-D tomography reviewed and encouraged. UA, no Pap, screening guidelines reviewed.  Yuliza Cara J WHNP, 1:10 PM 10/12/2014

## 2014-10-12 NOTE — Patient Instructions (Signed)
Vit D 2000 over the counter Health Recommendations for Postmenopausal Women Respected and ongoing research has looked at the most common causes of death, disability, and poor quality of life in postmenopausal women. The causes include heart disease, diseases of blood vessels, diabetes, depression, cancer, and bone loss (osteoporosis). Many things can be done to help lower the chances of developing these and other common problems. CARDIOVASCULAR DISEASE Heart Disease: A heart attack is a medical emergency. Know the signs and symptoms of a heart attack. Below are things women can do to reduce their risk for heart disease.   Do not smoke. If you smoke, quit.  Aim for a healthy weight. Being overweight causes many preventable deaths. Eat a healthy and balanced diet and drink an adequate amount of liquids.  Get moving. Make a commitment to be more physically active. Aim for 30 minutes of activity on most, if not all days of the week.  Eat for heart health. Choose a diet that is low in saturated fat and cholesterol and eliminate trans fat. Include whole grains, vegetables, and fruits. Read and understand the labels on food containers before buying.  Know your numbers. Ask your caregiver to check your blood pressure, cholesterol (total, HDL, LDL, triglycerides) and blood glucose. Work with your caregiver on improving your entire clinical picture.  High blood pressure. Limit or stop your table salt intake (try salt substitute and food seasonings). Avoid salty foods and drinks. Read labels on food containers before buying. Eating well and exercising can help control high blood pressure. STROKE  Stroke is a medical emergency. Stroke may be the result of a blood clot in a blood vessel in the brain or by a brain hemorrhage (bleeding). Know the signs and symptoms of a stroke. To lower the risk of developing a stroke:  Avoid fatty foods.  Quit smoking.  Control your diabetes, blood pressure, and irregular  heart rate. THROMBOPHLEBITIS (BLOOD CLOT) OF THE LEG  Becoming overweight and leading a stationary lifestyle may also contribute to developing blood clots. Controlling your diet and exercising will help lower the risk of developing blood clots. CANCER SCREENING  Breast Cancer: Take steps to reduce your risk of breast cancer.  You should practice "breast self-awareness." This means understanding the normal appearance and feel of your breasts and should include breast self-examination. Any changes detected, no matter how small, should be reported to your caregiver.  After age 76, you should have a clinical breast exam (CBE) every year.  Starting at age 11, you should consider having a mammogram (breast X-ray) every year.  If you have a family history of breast cancer, talk to your caregiver about genetic screening.  If you are at high risk for breast cancer, talk to your caregiver about having an MRI and a mammogram every year.  Intestinal or Stomach Cancer: Tests to consider are a rectal exam, fecal occult blood, sigmoidoscopy, and colonoscopy. Women who are high risk may need to be screened at an earlier age and more often.  Cervical Cancer:  Beginning at age 66, you should have a Pap test every 3 years as long as the past 3 Pap tests have been normal.  If you have had past treatment for cervical cancer or a condition that could lead to cancer, you need Pap tests and screening for cancer for at least 20 years after your treatment.  If you had a hysterectomy for a problem that was not cancer or a condition that could lead to cancer, then you  no longer need Pap tests.  If you are between ages 70 and 87, and you have had normal Pap tests going back 10 years, you no longer need Pap tests.  If Pap tests have been discontinued, risk factors (such as a new sexual partner) need to be reassessed to determine if screening should be resumed.  Some medical problems can increase the chance of  getting cervical cancer. In these cases, your caregiver may recommend more frequent screening and Pap tests.  Uterine Cancer: If you have vaginal bleeding after reaching menopause, you should notify your caregiver.  Ovarian Cancer: Other than yearly pelvic exams, there are no reliable tests available to screen for ovarian cancer at this time except for yearly pelvic exams.  Lung Cancer: Yearly chest X-rays can detect lung cancer and should be done on high risk women, such as cigarette smokers and women with chronic lung disease (emphysema).  Skin Cancer: A complete body skin exam should be done at your yearly examination. Avoid overexposure to the sun and ultraviolet light lamps. Use a strong sun block cream when in the sun. All of these things are important for lowering the risk of skin cancer. MENOPAUSE Menopause Symptoms: Hormone therapy products are effective for treating symptoms associated with menopause:  Moderate to severe hot flashes.  Night sweats.  Mood swings.  Headaches.  Tiredness.  Loss of sex drive.  Insomnia.  Other symptoms. Hormone replacement carries certain risks, especially in older women. Women who use or are thinking about using estrogen or estrogen with progestin treatments should discuss that with their caregiver. Your caregiver will help you understand the benefits and risks. The ideal dose of hormone replacement therapy is not known. The Food and Drug Administration (FDA) has concluded that hormone therapy should be used only at the lowest doses and for the shortest amount of time to reach treatment goals.  OSTEOPOROSIS Protecting Against Bone Loss and Preventing Fracture If you use hormone therapy for prevention of bone loss (osteoporosis), the risks for bone loss must outweigh the risk of the therapy. Ask your caregiver about other medications known to be safe and effective for preventing bone loss and fractures. To guard against bone loss or fractures, the  following is recommended:  If you are younger than age 44, take 1000 mg of calcium and at least 600 mg of Vitamin D per day.  If you are older than age 21 but younger than age 52, take 1200 mg of calcium and at least 600 mg of Vitamin D per day.  If you are older than age 68, take 1200 mg of calcium and at least 800 mg of Vitamin D per day. Smoking and excessive alcohol intake increases the risk of osteoporosis. Eat foods rich in calcium and vitamin D and do weight bearing exercises several times a week as your caregiver suggests. DIABETES Diabetes Mellitus: If you have type I or type 2 diabetes, you should keep your blood sugar under control with diet, exercise, and recommended medication. Avoid starchy and fatty foods, and too many sweets. Being overweight can make diabetes control more difficult. COGNITION AND MEMORY Cognition and Memory: Menopausal hormone therapy is not recommended for the prevention of cognitive disorders such as Alzheimer's disease or memory loss.  DEPRESSION  Depression may occur at any age, but it is common in elderly women. This may be because of physical, medical, social (loneliness), or financial problems and needs. If you are experiencing depression because of medical problems and control of symptoms, talk  to your caregiver about this. Physical activity and exercise may help with mood and sleep. Community and volunteer involvement may improve your sense of value and worth. If you have depression and you feel that the problem is getting worse or becoming severe, talk to your caregiver about which treatment options are best for you. ACCIDENTS  Accidents are common and can be serious in elderly woman. Prepare your house to prevent accidents. Eliminate throw rugs, place hand bars in bath, shower, and toilet areas. Avoid wearing high heeled shoes or walking on wet, snowy, and icy areas. Limit or stop driving if you have vision or hearing problems, or if you feel you are  unsteady with your movements and reflexes. HEPATITIS C Hepatitis C is a type of viral infection affecting the liver. It is spread mainly through contact with blood from an infected person. It can be treated, but if left untreated, it can lead to severe liver damage over the years. Many people who are infected do not know that the virus is in their blood. If you are a "baby-boomer", it is recommended that you have one screening test for Hepatitis C. IMMUNIZATIONS  Several immunizations are important to consider having during your senior years, including:   Tetanus, diphtheria, and pertussis booster shot.  Influenza every year before the flu season begins.  Pneumonia vaccine.  Shingles vaccine.  Others, as indicated based on your specific needs. Talk to your caregiver about these. Document Released: 07/26/2005 Document Revised: 10/18/2013 Document Reviewed: 03/21/2008 [facility] Patient Information 2015 [place], [place]. This information is not intended to replace advice given to you by your health care provider. Make sure you discuss any questions you have with your health care provider.

## 2014-11-22 ENCOUNTER — Other Ambulatory Visit: Payer: Self-pay | Admitting: Family Medicine

## 2015-02-16 ENCOUNTER — Encounter: Payer: Self-pay | Admitting: Family Medicine

## 2015-02-16 ENCOUNTER — Ambulatory Visit (INDEPENDENT_AMBULATORY_CARE_PROVIDER_SITE_OTHER): Payer: Medicare Other | Admitting: Family Medicine

## 2015-02-16 VITALS — BP 130/80 | Temp 97.2°F | Ht 62.75 in | Wt 213.8 lb

## 2015-02-16 DIAGNOSIS — I10 Essential (primary) hypertension: Secondary | ICD-10-CM

## 2015-02-16 DIAGNOSIS — M545 Low back pain: Secondary | ICD-10-CM

## 2015-02-16 DIAGNOSIS — R8290 Unspecified abnormal findings in urine: Secondary | ICD-10-CM

## 2015-02-16 DIAGNOSIS — M7989 Other specified soft tissue disorders: Secondary | ICD-10-CM

## 2015-02-16 DIAGNOSIS — M799 Soft tissue disorder, unspecified: Secondary | ICD-10-CM | POA: Diagnosis not present

## 2015-02-16 DIAGNOSIS — M25571 Pain in right ankle and joints of right foot: Secondary | ICD-10-CM | POA: Diagnosis not present

## 2015-02-16 LAB — POCT URINALYSIS DIPSTICK
Bilirubin, UA: NEGATIVE
Blood, UA: NEGATIVE
GLUCOSE UA: NEGATIVE
Ketones, UA: NEGATIVE
NITRITE UA: NEGATIVE
Spec Grav, UA: >=1.03
Urobilinogen, UA: NEGATIVE
pH, UA: 6

## 2015-02-16 NOTE — Patient Instructions (Signed)
  Recommend heat and stretches for the back.  Tylenol or ibuprofen if needed for pain. Increasing flexibility and strengthening core muscles (back and abdominal muscles) will help prevent back problems.  Consider yoga or pilates.  Your urine showed some Mayo cells.  We will send it for culture to look for infection, but you really don't have any urinary symptoms so we will not start antibiotics at this time.  Your right ankle/foot pain likely is not related to the swollen area that you have had for a long time.  This area itself wasn't tender.  If it grows quickly or becomes painful to touch, return for re-evaluation. Your pain with movement of the ankle may be related to the shoes you are walking in.  Try getting some new shoes and see if it gradually improves.  You may also want to try taking either ibuprofen or aleve regularly for up to a week to see if that helps with your back and ankle pain. Stop it if it causes stomach discomfort.  The discoloration in your left calf is simply related to your veins--more noticeable with standing. Nothing to worry about, as long as there is no pain or swelling around it.  It is not related to the tylenol  Schedule a physical with Dr. Redmond School. Your blood pressure was elevated today.  Bring your monitor and list of blood pressures to your physical with him.

## 2015-02-16 NOTE — Progress Notes (Signed)
Chief Complaint  Patient presents with  . Edema    right ankle that has a swollen spot that has been there for a couple years-hurts her when she "does her walking." Does also hurt when she is not walking. On her left ankle area (backside) she has a sore spot that looks bluish x a month. Wants to know if you can draw the fluid off her right ankle.   . Flu Vaccine    patient declines flu vaccine today-will get later on.   . Back Pain    also her lower back hurts x 1 month, wonders if it could be arthritis or UTI.     Swollen area on the front of her left ankle "for a while", about 5 years. She states she has shown this to Dr. Redmond School before.  She walks daily, and she has some achiness after walking, in that area of the leg, and across the whole front of the ankle. It has become somewhat uncomfortable.  She walks every day.  Shoes are old.  She notices a blue spot on the back of the left calf only after taking tylenol.  It lasts for 2-3 days and then goes away.  Denies any trauma or injury.  She hasn't taken any NSAIDs.  She has some pain in her lower back when she gets out of bed.  Denies urgency/frequency/hematuria, abdominal pain or odor to the urine.  She hasn't tried heating pad or stretches.  Pain doesn't radiate into the leg. No numbness or tingling or weakness of the leg. No fever or chills, no flank pain, no abdominal pain.  PMH, [place] SH reviewed.  Current Outpatient Prescriptions on File Prior to Visit  Medication Sig Dispense Refill  . aspirin 81 MG tablet Take 81 mg by mouth 3 (three) times a week.      . cetirizine (ZYRTEC) 10 MG tablet Take 10 mg by mouth daily.      Marland Kitchen lisinopril-hydrochlorothiazide (PRINZIDE,ZESTORETIC) 20-25 MG per tablet Take 1 tablet by mouth  daily 60 tablet 0  . Multiple Vitamins-Minerals (MULTIVITAMIN WITH MINERALS) tablet Take 1 tablet by mouth daily.    . Omega-3 Fatty Acids (FISH OIL PO) Take 2 capsules by mouth daily.     . Acetaminophen (TYLENOL  ARTHRITIS PAIN PO) Take by mouth.     No current facility-administered medications on file prior to visit.   No Known Allergies  ROS:  No fever, chills, URI symptoms, nausea, vomiting, abdominal pain, urinary complaints, bleeding, rash, chest pain, headaches, dizziness.  See HPI.  PHYSICAL EXAM: BP 148/80 mmHg  Temp(Src) 97.2 F (36.2 C) (Tympanic)  Ht 5' 2.75" (1.594 m)  Wt 213 lb 12.8 oz (96.979 kg)  BMI 38.17 kg/m2 Pleasant, obese female in no distress. HEENT: esotropia left eye, chronic. Sclera anicteric Back: no CVA tenderness, no spinal tenderness, no SI tenderness Abdomen: soft, no suprapubic tenderness. Extremities: normal pulses, no edema.   Soft, 3cm, mobile nontender soft tissue mass at anterior right ankle, slightly lateral of midline. She has pain more diffusely at the anterior ankle when she dorsiflexes and plantarflexes the foot. With standing, there appeared a 2-3 cm darker area to the skin (that was not there when she was seated) of the left posterior calf.  It was nontender. There was a palpable vein below this area, that was not tender. Neuro: normal strength, sensation, gait  ASSESSMENT/PLAN:  Low back pain, unspecified back pain laterality, with sciatica presence unspecified - suspect muscular.  Heat, stretches, NSAID's  prn. core strengthening and increasing flexibility recommended - Plan: POCT Urinalysis Dipstick, Urine culture  Abnormal urinalysis - really no urinary symptoms. urine culture sent - Plan: Urine culture  Soft tissue mass - right anterior ankle.  possible lipoma; not likely ganlgion given how soft/mobile it was. nontender and fairly stable in size. Continue to monitor  Right ankle pain - of anterior ankle. suspect tendonous, likely related to walking.  Encouraged her to get new shoes  Essential hypertension - BP elevated today. Encouraged weight loss, low sodium diet, and monitor BP's. Due to f/u with Dr. Redmond School for med check/physical and  labs  Recommend heat and stretches for the back.  Tylenol or ibuprofen if needed for pain. Increasing flexibility and strengthening core muscles (back and abdominal muscles) will help prevent back problems.  Consider yoga or pilates.  Your urine showed some Heney cells.  We will send it for culture to look for infection, but you really don't have any urinary symptoms so we will not start antibiotics at this time.  Your right ankle/foot pain likely is not related to the swollen area that you have had for a long time.  This area itself wasn't tender.  If it grows quickly or becomes painful to touch, return for re-evaluation. Your pain with movement of the ankle may be related to the shoes you are walking in.  Try getting some new shoes and see if it gradually improves.  You may also want to try taking either ibuprofen or aleve regularly for up to a week to see if that helps with your back and ankle pain. Stop it if it causes stomach discomfort.  The discoloration in your left calf is simply related to your veins--more noticeable with standing. Nothing to worry about, as long as there is no pain or swelling around it.  It is not related to the tylenol  Schedule a physical with Dr. Redmond School. Your blood pressure was elevated today.  Bring your monitor and list of blood pressures to your physical with him.

## 2015-02-18 LAB — URINE CULTURE: Colony Count: 70000

## 2015-02-21 ENCOUNTER — Telehealth: Payer: Self-pay

## 2015-02-21 MED ORDER — IBUPROFEN 800 MG PO TABS: 800.0000 mg | ORAL_TABLET | Freq: Three times a day (TID) | ORAL | Status: DC | PRN

## 2015-02-21 NOTE — Telephone Encounter (Signed)
Done. Advise pt

## 2015-02-21 NOTE — Telephone Encounter (Signed)
Pt is requesting 800mg  Motrin to be sent to the [facility] on [address]

## 2015-03-01 ENCOUNTER — Telehealth: Payer: Self-pay

## 2015-03-01 NOTE — Telephone Encounter (Signed)
Refill request for Lisinopril-hydrochlorothiazide 20-25mg  #60 (pt has a Med check on 03/09/15)

## 2015-03-02 MED ORDER — LISINOPRIL-HYDROCHLOROTHIAZIDE 20-25 MG PO TABS: ORAL_TABLET | ORAL | Status: DC

## 2015-03-09 ENCOUNTER — Other Ambulatory Visit: Payer: Self-pay | Admitting: Family Medicine

## 2015-03-09 ENCOUNTER — Ambulatory Visit
Admission: RE | Admit: 2015-03-09 | Discharge: 2015-03-09 | Disposition: A | Discharge status: Home / Self Care | Payer: Medicare Other | Source: Ambulatory Visit | Attending: Family Medicine | Admitting: Family Medicine

## 2015-03-09 ENCOUNTER — Encounter: Payer: Self-pay | Admitting: Family Medicine

## 2015-03-09 ENCOUNTER — Ambulatory Visit (INDEPENDENT_AMBULATORY_CARE_PROVIDER_SITE_OTHER): Payer: Medicare Other | Admitting: Family Medicine

## 2015-03-09 VITALS — BP 130/74 | HR 88 | Ht 63.0 in | Wt 213.0 lb

## 2015-03-09 DIAGNOSIS — H5010 Unspecified exotropia: Secondary | ICD-10-CM | POA: Insufficient documentation

## 2015-03-09 DIAGNOSIS — M25571 Pain in right ankle and joints of right foot: Secondary | ICD-10-CM | POA: Diagnosis not present

## 2015-03-09 DIAGNOSIS — E669 Obesity, unspecified: Secondary | ICD-10-CM

## 2015-03-09 DIAGNOSIS — Z23 Encounter for immunization: Secondary | ICD-10-CM

## 2015-03-09 DIAGNOSIS — I10 Essential (primary) hypertension: Secondary | ICD-10-CM | POA: Diagnosis not present

## 2015-03-09 DIAGNOSIS — H50112 Monocular exotropia, left eye: Secondary | ICD-10-CM

## 2015-03-09 DIAGNOSIS — J302 Other seasonal allergic rhinitis: Secondary | ICD-10-CM

## 2015-03-09 DIAGNOSIS — E785 Hyperlipidemia, unspecified: Secondary | ICD-10-CM

## 2015-03-09 LAB — CBC WITH DIFFERENTIAL/PLATELET
BASOS ABS: 0 10*3/uL (ref 0.0–0.1)
BASOS PCT: 0 % (ref 0–1)
EOS ABS: 0.2 10*3/uL (ref 0.0–0.7)
Eosinophils Relative: 3 % (ref 0–5)
HCT: 39 % (ref 36.0–46.0)
HEMOGLOBIN: 12.5 g/dL (ref 12.0–15.0)
Lymphocytes Relative: 29 % (ref 12–46)
Lymphs Abs: 1.6 10*3/uL (ref 0.7–4.0)
MCH: 28.7 pg (ref 26.0–34.0)
MCHC: 32.1 g/dL (ref 30.0–36.0)
MCV: 89.4 fL (ref 78.0–100.0)
MONOS PCT: 7 % (ref 3–12)
MPV: 10.6 fL (ref 8.6–12.4)
Monocytes Absolute: 0.4 10*3/uL (ref 0.1–1.0)
NEUTROS ABS: 3.4 10*3/uL (ref 1.7–7.7)
NEUTROS PCT: 61 % (ref 43–77)
PLATELETS: 210 10*3/uL (ref 150–400)
RBC: 4.36 MIL/uL (ref 3.87–5.11)
RDW: 14 % (ref 11.5–15.5)
WBC: 5.5 10*3/uL (ref 4.0–10.5)

## 2015-03-09 LAB — COMPREHENSIVE METABOLIC PANEL
ALBUMIN: 4 g/dL (ref 3.6–5.1)
ALK PHOS: 59 U/L (ref 33–130)
ALT: 16 U/L (ref 6–29)
AST: 16 U/L (ref 10–35)
BILIRUBIN TOTAL: 0.5 mg/dL (ref 0.2–1.2)
BUN: 24 mg/dL (ref 7–25)
CHLORIDE: 105 mmol/L (ref 98–110)
CO2: 29 mmol/L (ref 20–31)
CREATININE: 0.89 mg/dL (ref 0.50–0.99)
Calcium: 9.3 mg/dL (ref 8.6–10.4)
Glucose, Bld: 118 mg/dL — ABNORMAL HIGH (ref 65–99)
Potassium: 3.7 mmol/L (ref 3.5–5.3)
SODIUM: 144 mmol/L (ref 135–146)
TOTAL PROTEIN: 7 g/dL (ref 6.1–8.1)

## 2015-03-09 LAB — LIPID PANEL
CHOLESTEROL: 220 mg/dL — AB (ref 125–200)
HDL: 38 mg/dL — ABNORMAL LOW (ref >=46)
LDL Cholesterol: 160 mg/dL — ABNORMAL HIGH (ref <130)
TRIGLYCERIDES: 112 mg/dL (ref <150)
Total CHOL/HDL Ratio: 5.8 Ratio — ABNORMAL HIGH (ref <=5.0)
VLDL: 22 mg/dL (ref <30)

## 2015-03-09 IMAGING — CR DG ANKLE COMPLETE 3+V*R*
3 series · 3 of 3 positions shown · non-contrast
Comparison: None.

CLINICAL DATA: Ankle pain for several weeks, no injury

EXAM:
RIGHT ANKLE - COMPLETE 3+ VIEW

[t ankle joint ap right]
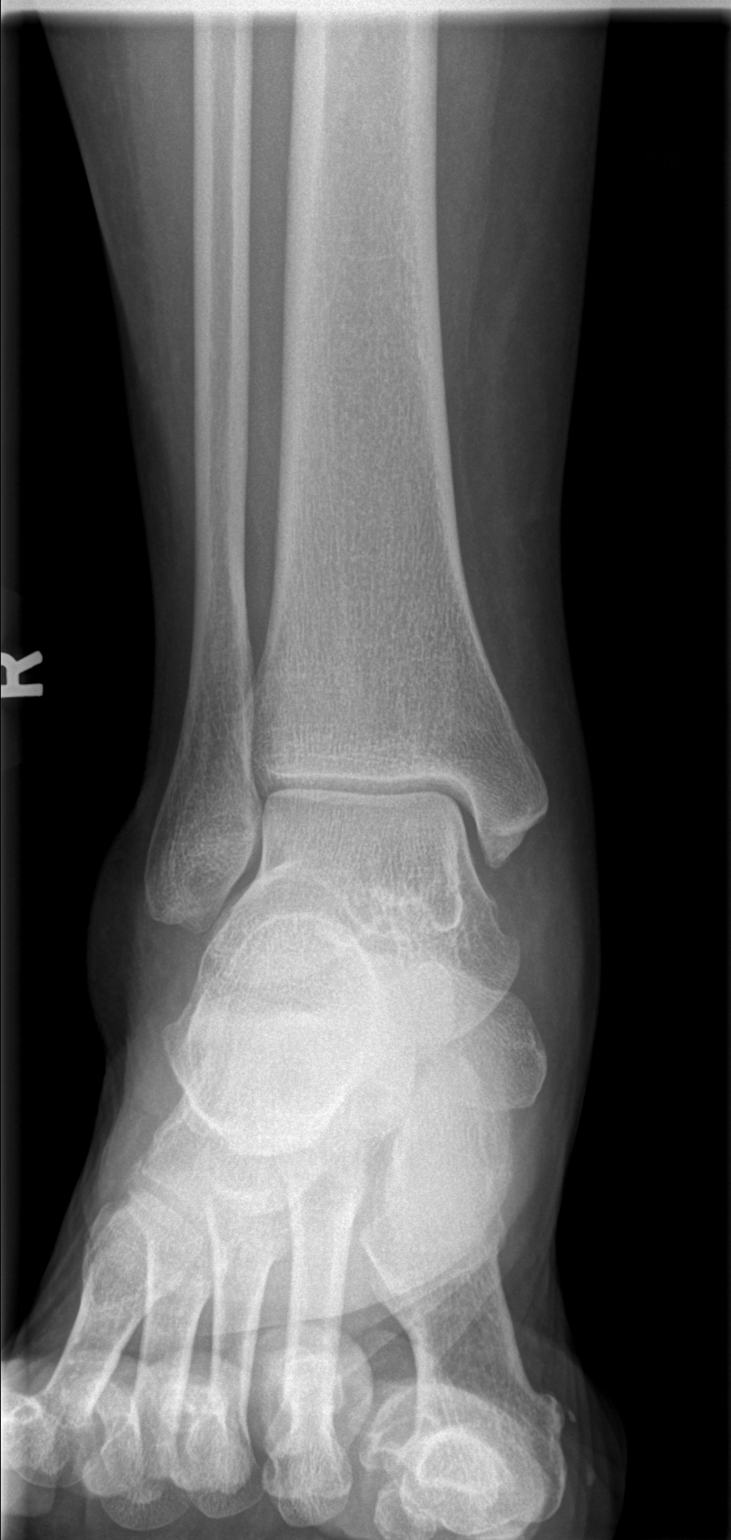

[t ankle joint oblique right]
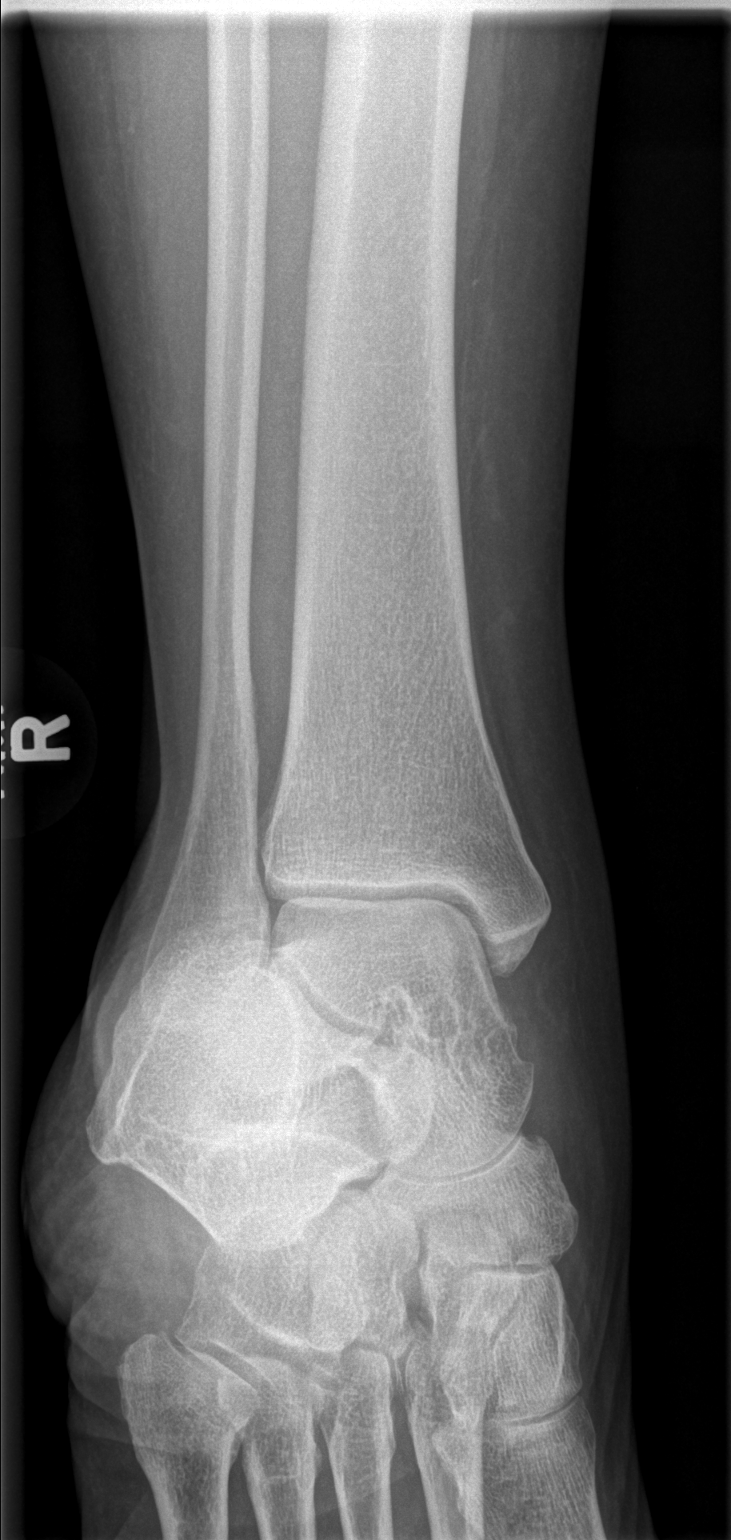

[t ankle joint lat right]
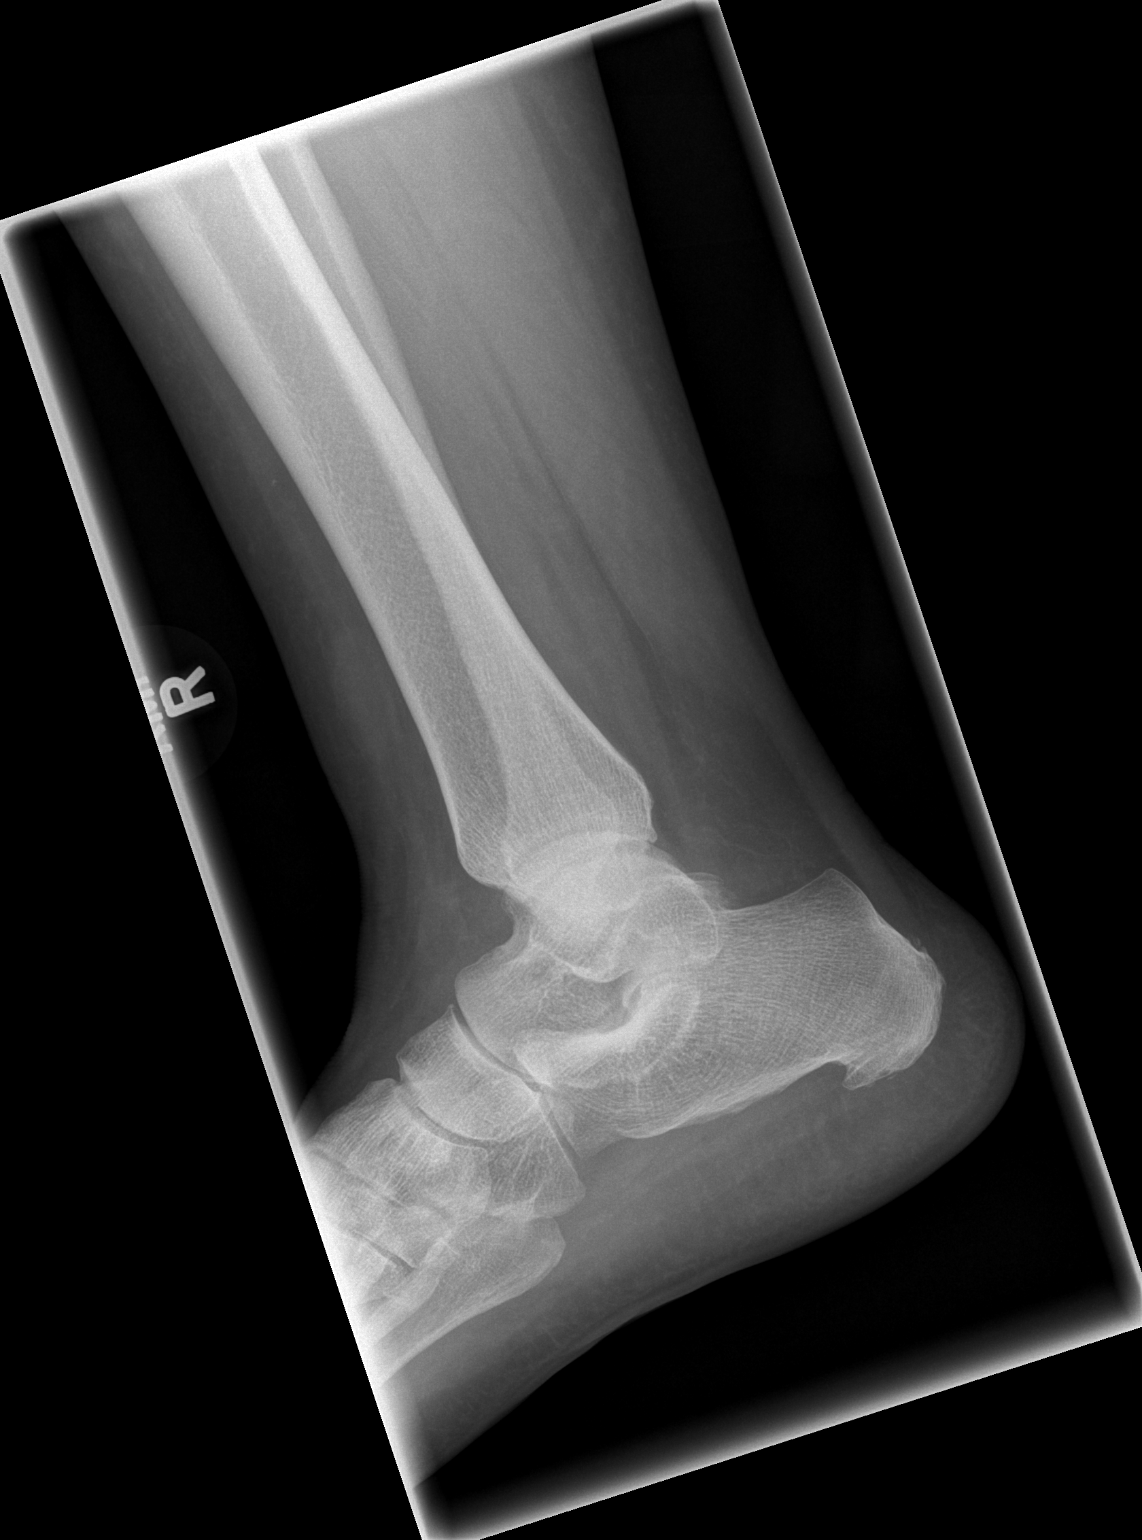

[3 of 3 positions shown; findings below may reference images not displayed]

FINDINGS: No fracture is seen. Alignment is normal. The ankle joint space
appears normal. A small plantar calcaneal degenerative spur is
present.
IMPRESSION: No acute abnormality.  Small plantar calcaneal degenerative spur.

## 2015-03-09 MED ORDER — LISINOPRIL-HYDROCHLOROTHIAZIDE 20-25 MG PO TABS: ORAL_TABLET | ORAL | Status: DC

## 2015-03-09 NOTE — Progress Notes (Signed)
   Subjective:    Patient ID: Angel Ortega, female    DOB: 02/10/1951, 64 y.o.   MRN: [id number]  HPI   She is here for an interval evaluation. She does have a three-month history of right lower extremity pain especially in the ankle. She has been walking regularly and this has interfered with it. No historyof injury. No other joints are involved. Her allergies are under good control. She continues on her blood pressure medication and is having no difficulty with that. She does have a history of hyperlipidemia and presently is on no medications.   Review of Systems     Objective:   Physical Exam Alert and in no distress.Left eye does deviate laterally Tympanic membranes and canals are normal. Pharyngeal area is normal. Neck is supple without adenopathy or thyromegaly. Cardiac exam shows a regular sinus rhythm without murmurs or gallops. Lungs are clear to auscultation. ) Ankle exam does shows minimal tenderness palpation over the talar dome as well as medial and lateral collateral ligaments. Full motion of the ankle some pain with full plantar flexion.       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 I will get an ankle x-ray. The symptoms and exam point towards arthritis. Continue her other medications. Routine blood screening.

## 2015-03-10 LAB — HEMOGLOBIN A1C
HEMOGLOBIN A1C: 6.3 % — AB (ref <5.7)
MEAN PLASMA GLUCOSE: 134 mg/dL — AB (ref <117)

## 2015-03-13 NOTE — Progress Notes (Signed)
MAILED PT LETTER OF LAB RESULTS

## 2015-04-03 ENCOUNTER — Telehealth: Payer: Self-pay | Admitting: Family Medicine

## 2015-04-03 MED ORDER — IBUPROFEN 800 MG PO TABS: 800.0000 mg | ORAL_TABLET | Freq: Three times a day (TID) | ORAL | Status: DC | PRN

## 2015-04-03 NOTE — Telephone Encounter (Signed)
Pt called requesting a refill for her RX ibuprofen [facility] [address] pt can be reached at [phone] (H)

## 2015-04-03 NOTE — Telephone Encounter (Signed)
DR.LALONDE IS THIS OKAY TO REFILL

## 2015-04-04 ENCOUNTER — Other Ambulatory Visit: Payer: Self-pay | Admitting: Family Medicine

## 2015-07-24 ENCOUNTER — Telehealth: Payer: Self-pay | Admitting: Family Medicine

## 2015-07-24 NOTE — Telephone Encounter (Signed)
Pt called as she had to cancel her med check in December.  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  She is off Monday 08/07/15.  Would you consider allowing another Med check that morning for her?  Please let pt know [phone]

## 2015-07-24 NOTE — Telephone Encounter (Signed)
There is already a hold on the schedule for that day for another med check (the 5th for the morning).  Verdene Lennert called the other person and left a message.  If she doesn't take that appointment (see hold on schedule) then I will see Rihanna. Otherwise, might have to off Thayer Headings an appointment with Loletha Carrow, or another provider that is routinely here on Fridays.

## 2015-07-26 NOTE — Telephone Encounter (Signed)
Verdene Lennert, please let me know once you receive a return phone call.

## 2015-07-26 NOTE — Telephone Encounter (Signed)
Spoke with patient and she did take that appt for med check on 08/07/15.

## 2015-07-26 NOTE — Telephone Encounter (Signed)
OTHER pt took 2/20 appt.  Doneen still needs to schedule one.  Since I am not here on Fridays, offer appt with [facility]

## 2015-07-27 NOTE — Telephone Encounter (Signed)
Called patient and she did not ask to be seen on 08/07/15, but was very understanding of the mistake. I looked up the phone number that was put on the original message and the number is actually [place] [facility] number. Just an FYI. Thanks.

## 2015-07-27 NOTE — Telephone Encounter (Signed)
Noted.  That makes more sense that there was an error. PCP is Dr. Red[facility] School, not me

## 2015-10-30 LAB — HM MAMMOGRAPHY

## 2015-10-31 ENCOUNTER — Encounter: Payer: Self-pay | Admitting: Family Medicine

## 2015-11-02 LAB — HM MAMMOGRAPHY

## 2015-11-06 ENCOUNTER — Other Ambulatory Visit: Payer: Self-pay | Admitting: Radiology

## 2015-11-09 ENCOUNTER — Encounter: Payer: Self-pay | Admitting: Family Medicine

## 2015-11-27 ENCOUNTER — Ambulatory Visit (INDEPENDENT_AMBULATORY_CARE_PROVIDER_SITE_OTHER): Payer: Medicare Other | Admitting: Family Medicine

## 2015-11-27 ENCOUNTER — Encounter: Payer: Self-pay | Admitting: Family Medicine

## 2015-11-27 VITALS — BP 157/99 | HR 72 | Ht 63.0 in | Wt 210.8 lb

## 2015-11-27 DIAGNOSIS — E669 Obesity, unspecified: Secondary | ICD-10-CM | POA: Diagnosis not present

## 2015-11-27 DIAGNOSIS — I10 Essential (primary) hypertension: Secondary | ICD-10-CM | POA: Diagnosis not present

## 2015-11-27 DIAGNOSIS — E785 Hyperlipidemia, unspecified: Secondary | ICD-10-CM | POA: Diagnosis not present

## 2015-11-27 DIAGNOSIS — M71371 Other bursal cyst, right ankle and foot: Secondary | ICD-10-CM | POA: Diagnosis not present

## 2015-11-27 DIAGNOSIS — J302 Other seasonal allergic rhinitis: Secondary | ICD-10-CM

## 2015-11-27 DIAGNOSIS — R7301 Impaired fasting glucose: Secondary | ICD-10-CM | POA: Diagnosis not present

## 2015-11-27 DIAGNOSIS — M25571 Pain in right ankle and joints of right foot: Secondary | ICD-10-CM

## 2015-11-27 DIAGNOSIS — H50112 Monocular exotropia, left eye: Secondary | ICD-10-CM

## 2015-11-27 DIAGNOSIS — Z1159 Encounter for screening for other viral diseases: Secondary | ICD-10-CM | POA: Diagnosis not present

## 2015-11-27 DIAGNOSIS — H5010 Unspecified exotropia: Secondary | ICD-10-CM

## 2015-11-27 LAB — CBC WITH DIFFERENTIAL/PLATELET
BASOS ABS: 0 {cells}/uL (ref 0–200)
Basophils Relative: 0 %
EOS ABS: 204 {cells}/uL (ref 15–500)
Eosinophils Relative: 4 %
HEMATOCRIT: 37.6 % (ref 35.0–45.0)
HEMOGLOBIN: 12.2 g/dL (ref 11.7–15.5)
LYMPHS ABS: 1632 {cells}/uL (ref 850–3900)
Lymphocytes Relative: 32 %
MCH: 28.7 pg (ref 27.0–33.0)
MCHC: 32.4 g/dL (ref 32.0–36.0)
MCV: 88.5 fL (ref 80.0–100.0)
MPV: 10.6 fL (ref 7.5–12.5)
Monocytes Absolute: 306 cells/uL (ref 200–950)
Monocytes Relative: 6 %
NEUTROS ABS: 2958 {cells}/uL (ref 1500–7800)
NEUTROS PCT: 58 %
Platelets: 215 10*3/uL (ref 140–400)
RBC: 4.25 MIL/uL (ref 3.80–5.10)
RDW: 14.1 % (ref 11.0–15.0)
WBC: 5.1 10*3/uL (ref 4.0–10.5)

## 2015-11-27 NOTE — Patient Instructions (Addendum)
Write the ABCs with your big toe several times per day and if after a month you are still having trouble, call me  Ms. Maclay , Thank you for taking time to come for your Medicare Wellness Visit. I appreciate your ongoing commitment to your health goals. Please review the following plan we discussed and let me know if I can assist you in the future.   These are the goals we discussed: Goals    None      This is a list of the screening recommended for you and due dates:  Health Maintenance  Topic Date Due  .  Hepatitis C: One time screening is recommended by Center for Disease Control  (CDC) for  adults born from [birth year] through 1967.   27-Mar-1951  . Pap Smear  12/23/2010  . Pneumonia vaccines (2 of 2 - PPSV23) 12/14/2014  . Colon Cancer Screening  08/30/2015  . Flu Shot  01/16/2016  . Mammogram  11/01/2017  . Tetanus Vaccine  12/02/2018  . DEXA scan (bone density measurement)  Completed  . Shingles Vaccine  Completed

## 2015-11-27 NOTE — Progress Notes (Signed)
Angel Ortega is a 65 y.o. female who presents for annual medication check and wellness She has the following concerns: She apparently injured her right ankle 5 years ago. She has had previous x-rays which were negative. She complains of pain interfering with her physical activities. She also has an underlying history of allergies and does use Zyrtec. She continues on her blood pressure medications. She is also on OTC medications. She was recently visited by the insurance plan and apparently a Cologuard was ordered. She is now retired and keeps herself quite busy. She is married and the marriage is going well. Immunizations and Health Maintenance Immunization History  Administered Date(s) Administered  . Influenza, High Dose Seasonal PF 05/11/2014, 03/09/2015  . Pneumococcal Conjugate-13 12/13/2013  . Pneumococcal Polysaccharide-23 12/01/2008  . Tdap 12/01/2008  . Zoster 12/01/2008   Health Maintenance Due  Topic Date Due  . Hepatitis C Screening  [birth year]  . PAP SMEAR  12/23/2010  . PNA vac Low Risk Adult (2 of 2 - PPSV23) 12/14/2014  . COLONOSCOPY  08/30/2015    Last Pap smear: had partial hystectomy Last mammogram: may 2017 Last colonoscopy: due next year Last DEXA: 2010 Dentist: Dr. Gerlene Burdock Ophtho: Dr. Tyna Jaksch Exercise: walking  Other doctors caring for patient include:none   Advanced directives:done    Depression screen:  See questionnaire below.  Depression screen PHQ 2/9 11/27/2015  Decreased Interest 0  Down, Depressed, Hopeless 0  PHQ - 2 Score 0    Fall Risk Screen: see questionnaire below. Fall Risk  11/27/2015  Falls in the past year? No    ADL screen:  See questionnaire below Functional Status Survey: Is the patient deaf or have difficulty hearing?: No Does the patient have difficulty seeing, even when wearing glasses/contacts?: No Does the patient have difficulty concentrating, remembering, or making decisions?: No Does the patient have difficulty walking  or climbing stairs?: No Does the patient have difficulty dressing or bathing?: No Does the patient have difficulty doing errands alone such as visiting a doctor's office or shopping?: No   Review of Systems Constitutional: -, -unexpected weight change, -anorexia, -fatigue Allergy: -sneezing, -itching, -congestion Dermatology: denies changing moles, rash, lumps ENT: -runny nose, -ear pain, -sore throat,  Cardiology:  -chest pain, -palpitations, -orthopnea, Respiratory: -cough, -shortness of breath, -dyspnea on exertion, -wheezing,  Gastroenterology: -abdominal pain, -nausea, -vomiting, -diarrhea, -constipation, -dysphagia Hematology: -bleeding or bruising problems Musculoskeletal: -arthralgias, -myalgias, -joint swelling, -back pain, - Ophthalmology: -vision changes,  Urology: -dysuria, -difficulty urinating,  -urinary frequency, -urgency, incontinence Neurology: -, -numbness, , -memory loss, -falls, -dizziness    PHYSICAL EXAM:  Ht 5\' 3"  (1.6 m)  Wt 210 lb 12.8 oz (95.618 kg)  BMI 37.35 kg/m2  General Appearance: Alert, cooperative, no distress, appears stated age Head: Normocephalic, without obvious abnormality, atraumatic Eyes: PERRL, conjunctiva/corneas clear, esotropia noted Ears: Normal TM's and external ear canals Nose: Nares normal, mucosa normal, no drainage or sinus tenderness Throat: Lips, mucosa, and tongue normal; teeth and gums normal Neck: Supple, no lymphadenopathy;  thyroid:  no enlargement/tenderness/nodules; no carotid bruit or JVD Lungs: Clear to auscultation bilaterally without wheezes, rales or ronchi; respirations unlabored Heart: Regular rate and rhythm, S1 and S2 normal, no murmur, rubor gallop Abdomen: Soft, non-tender, nondistended, normoactive bowel sounds,  no masses, no hepatosplenomegaly Extremities: No clubbing, cyanosis or edema. Cystic 3 cm lesion noted just anterior to the lateral malleolus. Full motion of the ankle. No pain. Normal  strength. Pulses: 2+ and symmetric all extremities Skin:  Skin color, texture,  turgor normal, no rashes or lesions Lymph nodes: Cervical, supraclavicular, and axillary nodes normal Neurologic:  CNII-XII intact, normal strength, sensation and gait; reflexes 2+ and symmetric throughout Psych: Normal mood, affect, hygiene and grooming.  ASSESSMENT/PLAN: Essential hypertension, benign - Plan: CBC with Differential/Platelet, Comprehensive metabolic panel  Impaired fasting glucose - Plan: Comprehensive metabolic panel  Allergic rhinitis, seasonal  Obesity (BMI 30-39.9) - Plan: Comprehensive metabolic panel, Lipid panel  Exotropia of left eye  Hyperlipidemia LDL goal <130 - Plan: Lipid panel  Need for hepatitis C screening test - Plan: Hepatitis C antibody  Right ankle pain  Other bursal cyst, right ankle and foot Discussed range of motion exercises by doing ABCs with her ankle. If she has continued difficulty, referral to orthopedic foot surgeon will be made. Recommend she use her allergy medications on an as-needed basis.    Discussed monthly self breast exams and yearly mammograms; at least 30 minutes of aerobic activity at least 5 days/week and weight-bearing exercise 2x/week; proper sunscreen use reviewed; healthy diet, including goals of calcium and vitamin D intake and alcohol recommendations (less than or equal to 1 drink/day) reviewed; regular seatbelt use; changing batteries in smoke detectors.  Immunization recommendations discussed.   Medicare Attestation I have personally reviewed: The patient's medical and social history Their use of alcohol, tobacco or illicit drugs Their current medications and supplements The patient's functional ability including ADLs,fall risks, home safety risks, cognitive, and hearing and visual impairment Diet and physical activities Evidence for depression or mood disorders  The patient's weight, height, and BMI have been recorded in the chart.   I have made referrals, counseling, and provided education to the patient based on review of the above and I have provided the patient with a written personalized care plan for preventive services.     Wyatt Haste, MD   11/27/2015

## 2015-11-28 LAB — LIPID PANEL
CHOL/HDL RATIO: 4.4 ratio (ref <=5.0)
CHOLESTEROL: 202 mg/dL — AB (ref 125–200)
HDL: 46 mg/dL (ref >=46)
LDL Cholesterol: 140 mg/dL — ABNORMAL HIGH (ref <130)
TRIGLYCERIDES: 80 mg/dL (ref <150)
VLDL: 16 mg/dL (ref <30)

## 2015-11-28 LAB — COMPREHENSIVE METABOLIC PANEL
ALBUMIN: 4 g/dL (ref 3.6–5.1)
ALT: 19 U/L (ref 6–29)
AST: 17 U/L (ref 10–35)
Alkaline Phosphatase: 63 U/L (ref 33–130)
BUN: 22 mg/dL (ref 7–25)
CALCIUM: 9.3 mg/dL (ref 8.6–10.4)
CHLORIDE: 102 mmol/L (ref 98–110)
CO2: 28 mmol/L (ref 20–31)
CREATININE: 0.94 mg/dL (ref 0.50–0.99)
Glucose, Bld: 101 mg/dL — ABNORMAL HIGH (ref 65–99)
POTASSIUM: 4.1 mmol/L (ref 3.5–5.3)
Sodium: 143 mmol/L (ref 135–146)
TOTAL PROTEIN: 7.1 g/dL (ref 6.1–8.1)
Total Bilirubin: 0.5 mg/dL (ref 0.2–1.2)

## 2015-11-28 LAB — HEPATITIS C ANTIBODY: HCV AB: NEGATIVE

## 2015-12-26 ENCOUNTER — Encounter: Payer: Self-pay | Admitting: Family Medicine

## 2016-01-30 ENCOUNTER — Other Ambulatory Visit: Payer: Self-pay | Admitting: Family Medicine

## 2016-01-30 DIAGNOSIS — I10 Essential (primary) hypertension: Secondary | ICD-10-CM

## 2016-05-28 ENCOUNTER — Encounter: Payer: Self-pay | Admitting: Family Medicine

## 2016-05-28 ENCOUNTER — Ambulatory Visit (INDEPENDENT_AMBULATORY_CARE_PROVIDER_SITE_OTHER): Payer: Medicare Other | Admitting: Family Medicine

## 2016-05-28 VITALS — BP 120/80 | HR 78 | Temp 97.9°F | Wt 215.0 lb

## 2016-05-28 DIAGNOSIS — M549 Dorsalgia, unspecified: Secondary | ICD-10-CM | POA: Diagnosis not present

## 2016-05-28 DIAGNOSIS — Z23 Encounter for immunization: Secondary | ICD-10-CM | POA: Diagnosis not present

## 2016-05-28 LAB — POCT URINALYSIS DIPSTICK
BILIRUBIN UA: NEGATIVE
GLUCOSE UA: NEGATIVE
Ketones, UA: NEGATIVE
NITRITE UA: NEGATIVE
RBC UA: NEGATIVE
Spec Grav, UA: 1.015
UROBILINOGEN UA: NEGATIVE
pH, UA: 6.5

## 2016-05-28 NOTE — Progress Notes (Signed)
   Subjective:    Patient ID: Angel Ortega, female    DOB: January 18, 1951, 65 y.o.   MRN: [id number]  HPI She complains of a several day history of back pain and is concerned about possible UTI. She has had no frequency, urgency, dysuria, fever or chills.   Review of Systems     Objective:   Physical Exam Alert and in no distress. Full motion of her back without pain. No palpable tenderness. Urine microscopic was contaminated.       Assessment & Plan:  Back pain, unspecified back location, unspecified back pain laterality, unspecified chronicity - Plan: POCT Urinalysis Dipstick  Need for prophylactic vaccination and inoculation against influenza - Plan: Flu vaccine HIGH DOSE PF (Fluzone High dose) Explained that I did not think her back pain was coming from a UTI. Recommended supportive care.

## 2016-08-21 ENCOUNTER — Telehealth: Payer: Self-pay

## 2016-08-21 NOTE — Telephone Encounter (Signed)
I informed pt of letter we received from [facility] about abnormal Mammo. That she did not show up for her follow up appointment she said when she was in she told Dr.Lalonde that they wanted her to come every 6 month and Dr.Lalonde told her that was her choice and she said she would call them and get appointment set up

## 2016-10-03 LAB — HM DIABETES EYE EXAM

## 2016-11-26 LAB — HM MAMMOGRAPHY

## 2016-11-27 ENCOUNTER — Ambulatory Visit: Payer: Medicare Other | Admitting: Family Medicine

## 2016-11-29 ENCOUNTER — Encounter: Payer: Self-pay | Admitting: Family Medicine

## 2016-12-31 ENCOUNTER — Telehealth: Payer: Self-pay | Admitting: Family Medicine

## 2016-12-31 NOTE — Telephone Encounter (Signed)
I have tried to call multi times it will ring then go fast busy

## 2016-12-31 NOTE — Telephone Encounter (Signed)
Pt was taking a fish oil supplement with 1200mg  of fish oil and 360mg  of omega3. PT is not able to find this same supplement currently so she wants to know which fish oil is the best to get.

## 2016-12-31 NOTE — Telephone Encounter (Signed)
Pt informed and verbalized understanding

## 2016-12-31 NOTE — Telephone Encounter (Signed)
Tell her that there are a lot of supplements. She will just have to find one that is close.

## 2017-01-13 ENCOUNTER — Ambulatory Visit (INDEPENDENT_AMBULATORY_CARE_PROVIDER_SITE_OTHER): Payer: Medicare Other | Admitting: Family Medicine

## 2017-01-13 ENCOUNTER — Encounter: Payer: Self-pay | Admitting: Family Medicine

## 2017-01-13 VITALS — BP 126/80 | HR 76 | Ht 63.0 in | Wt 215.0 lb

## 2017-01-13 DIAGNOSIS — Z1211 Encounter for screening for malignant neoplasm of colon: Secondary | ICD-10-CM

## 2017-01-13 DIAGNOSIS — Z0000 Encounter for general adult medical examination without abnormal findings: Secondary | ICD-10-CM | POA: Diagnosis not present

## 2017-01-13 DIAGNOSIS — H5010 Unspecified exotropia: Secondary | ICD-10-CM

## 2017-01-13 DIAGNOSIS — I10 Essential (primary) hypertension: Secondary | ICD-10-CM

## 2017-01-13 DIAGNOSIS — R7301 Impaired fasting glucose: Secondary | ICD-10-CM | POA: Diagnosis not present

## 2017-01-13 DIAGNOSIS — E669 Obesity, unspecified: Secondary | ICD-10-CM | POA: Diagnosis not present

## 2017-01-13 DIAGNOSIS — E785 Hyperlipidemia, unspecified: Secondary | ICD-10-CM | POA: Diagnosis not present

## 2017-01-13 DIAGNOSIS — J302 Other seasonal allergic rhinitis: Secondary | ICD-10-CM

## 2017-01-13 LAB — POCT URINALYSIS DIP (PROADVANTAGE DEVICE)
Bilirubin, UA: NEGATIVE
Glucose, UA: NEGATIVE mg/dL
Ketones, POC UA: NEGATIVE mg/dL
NITRITE UA: NEGATIVE
PH UA: 7.5 (ref 5.0–8.0)
PROTEIN UA: NEGATIVE mg/dL
RBC UA: NEGATIVE
Specific Gravity, Urine: 1.015
UUROB: NEGATIVE

## 2017-01-13 LAB — CBC WITH DIFFERENTIAL/PLATELET
BASOS PCT: 0 %
Basophils Absolute: 0 cells/uL (ref 0–200)
EOS PCT: 4 %
Eosinophils Absolute: 172 cells/uL (ref 15–500)
HCT: 38.7 % (ref 35.0–45.0)
Hemoglobin: 12.3 g/dL (ref 11.7–15.5)
LYMPHS PCT: 34 %
Lymphs Abs: 1462 cells/uL (ref 850–3900)
MCH: 28.9 pg (ref 27.0–33.0)
MCHC: 31.8 g/dL — ABNORMAL LOW (ref 32.0–36.0)
MCV: 90.8 fL (ref 80.0–100.0)
MONOS PCT: 7 %
MPV: 10.9 fL (ref 7.5–12.5)
Monocytes Absolute: 301 cells/uL (ref 200–950)
NEUTROS ABS: 2365 {cells}/uL (ref 1500–7800)
Neutrophils Relative %: 55 %
PLATELETS: 210 10*3/uL (ref 140–400)
RBC: 4.26 MIL/uL (ref 3.80–5.10)
RDW: 14.3 % (ref 11.0–15.0)
WBC: 4.3 10*3/uL (ref 4.0–10.5)

## 2017-01-13 LAB — COMPREHENSIVE METABOLIC PANEL
ALK PHOS: 60 U/L (ref 33–130)
ALT: 17 U/L (ref 6–29)
AST: 17 U/L (ref 10–35)
Albumin: 4 g/dL (ref 3.6–5.1)
BUN: 24 mg/dL (ref 7–25)
CO2: 27 mmol/L (ref 20–31)
Calcium: 9.1 mg/dL (ref 8.6–10.4)
Chloride: 104 mmol/L (ref 98–110)
Creat: 0.84 mg/dL (ref 0.50–0.99)
Glucose, Bld: 99 mg/dL (ref 65–99)
POTASSIUM: 4.1 mmol/L (ref 3.5–5.3)
SODIUM: 141 mmol/L (ref 135–146)
TOTAL PROTEIN: 6.7 g/dL (ref 6.1–8.1)
Total Bilirubin: 0.6 mg/dL (ref 0.2–1.2)

## 2017-01-13 LAB — LIPID PANEL
CHOL/HDL RATIO: 5 ratio — AB (ref <5.0)
CHOLESTEROL: 212 mg/dL — AB (ref <200)
HDL: 42 mg/dL — ABNORMAL LOW (ref >50)
LDL Cholesterol: 153 mg/dL — ABNORMAL HIGH (ref <100)
Triglycerides: 86 mg/dL (ref <150)
VLDL: 17 mg/dL (ref <30)

## 2017-01-13 MED ORDER — LISINOPRIL-HYDROCHLOROTHIAZIDE 20-25 MG PO TABS: 1.0000 | ORAL_TABLET | Freq: Every day | ORAL | 3 refills | Status: DC

## 2017-01-13 NOTE — Patient Instructions (Signed)
Something physical at least 20 minutes every day. You can do to 10 minute walks

## 2017-01-13 NOTE — Progress Notes (Signed)
Subjective:   HPI  Angel Ortega is a 66 y.o. female who presents for Chief Complaint  Patient presents with  . Medicare Wellness  . Annual Exam    Medical care team includes: Denita Lung, MD here for primary care      Preventative care: [facility] Last ophthalmology visit: 7/230/18 Dr.Noble Last dental visit:10/05/15 Last colonoscopy: 08/29/05  Last mammogram:11/26/16 Last pap: Hysterectomy Last TGG:YIRS Last labs:11/27/15  Prior vaccinations:  TD or Tdap: 12/01/08 Influenza: 05/28/16 Pneumococcal: 23 12/01/08 13:12/13/13 Shingles/Zostavax: 12/01/08. Shingrix prescription given Advanced directive: No. Information given. Concerns: She continues on her blood pressure medication and is having no difficulty with that. Her allergies are well controlled on Zyrtec. She does use omega-3. She walks 3 times per week and does occasionally have some left knee pain. She does have a previous history of hysterectomy due to fibroids. She is now retired. Home life is going quite well. She did have eye surgery several years ago but didn't correct her vision. He does have a history of hyperlipidemia as well as fasting elevated glucose. Her last colonoscopy was over 10 years ago.  Reviewed their medical, surgical, family, social, medication, and allergy history and updated chart as appropriate.  Past Medical History:  Diagnosis Date  . Fibroid   . Heel spur   . Hyperlipidemia   . Staph infection 2012    Past Surgical History:  Procedure Laterality Date  . ABDOMINAL HYSTERECTOMY  1996   TAH/FIBROIDS  . EYE MUSCLE SURGERY  child   L eye  . HERNIA REPAIR    . [place]  . Staph infection  2012   Right thigh    Social History   Social History  . Marital status: Married    Spouse name: N/A  . Number of children: N/A  . Years of education: N/A   Occupational History  . Not on file.   Social History Main Topics  . Smoking status: Never Smoker  .  Smokeless tobacco: Never Used  . Alcohol use No  . Drug use: No  . Sexual activity: Yes    Birth control/ protection: Surgical     Comment: 66 YEARS OLD, NO MORE THAN 5 PARTNERS   Other Topics Concern  . Not on file   Social History Narrative  . No narrative on file    Family History  Problem Relation Age of Onset  . Cancer Mother        lung cancer  . Diabetes Father   . Hypertension Father   . Diabetes Sister   . Cancer Brother        ?tumors in muscles  . Hyperlipidemia Sister   . Hypertension Sister   . Hyperlipidemia Sister   . Hypertension Sister      Current Outpatient Prescriptions:  .  Acetaminophen (TYLENOL ARTHRITIS PAIN PO), Take by mouth., Disp: , Rfl:  .  aspirin 81 MG tablet, Take 81 mg by mouth 3 (three) times a week.  , Disp: , Rfl:  .  cetirizine (ZYRTEC) 10 MG tablet, Take 10 mg by mouth daily.  , Disp: , Rfl:  .  ibuprofen (ADVIL,MOTRIN) 800 MG tablet, Take 1 tablet (800 mg total) by mouth every 8 (eight) hours as needed for moderate pain., Disp: 30 tablet, Rfl: 1 .  lisinopril-hydrochlorothiazide (PRINZIDE,ZESTORETIC) 20-25 MG tablet, Take 1 tablet by mouth  daily, Disp: 90 tablet, Rfl: 3 .  Multiple Vitamins-Minerals (MULTIVITAMIN WITH MINERALS) tablet, Take 1 tablet by mouth  daily., Disp: , Rfl:  .  Omega-3 Fatty Acids (FISH OIL PO), Take 2 capsules by mouth daily. , Disp: , Rfl:   No Known Allergies     Review of Systems Negative except as above Objective:  General appearance: alert, no distress, WD/WN, African American female Skin: Normal HEENT: normocephalic, conjunctiva/corneas normal, sclerae anicteric, PERRLA, EOMi, nares patent, no discharge or erythema, pharynx normal. Left eye does deviate laterally Oral cavity: MMM, tongue normal, teeth normal Neck: supple, no lymphadenopathy, no thyromegaly, no masses, normal ROM, no bruits Chest: non tender, normal shape and expansion Heart: RRR, normal S1, S2, no murmurs Lungs: CTA bilaterally,  no wheezes, rhonchi, or rales Abdomen: +bs, soft, non tender, non distended, no masses, no hepatomegaly, no splenomegaly, no bruits Musculoskeletal: upper extremities non tender, no obvious deformity, normal ROM throughout, lower extremities non tender, no obvious deformity, normal ROM throughout Extremities: no edema, no cyanosis, no clubbing Pulses: 2+ symmetric, upper and lower extremities, normal cap refill Neurological: alert, oriented x 3, CN2-12 intact, strength normal upper extremities and lower extremities, sensation normal throughout, DTRs 2+ throughout, no cerebellar signs, gait normal Psychiatric: normal affect, behavior normal, pleasant     Assessment and Plan :    Routine general medical examination at a health care facility - Plan: POCT Urinalysis DIP (Proadvantage Device), CBC with Differential/Platelet, Comprehensive metabolic panel, Lipid panel  Exotropia of left eye  Seasonal allergic rhinitis, unspecified trigger  Hyperlipidemia LDL goal <130 - Plan: Lipid panel  Impaired fasting glucose - Plan: Comprehensive metabolic panel  Obesity (BMI 30-39.9) - Plan: CBC with Differential/Platelet, Comprehensive metabolic panel, Lipid panel  Essential hypertension - Plan: lisinopril-hydrochlorothiazide (PRINZIDE,ZESTORETIC) 20-25 MG tablet  Screening for colon cancer - Plan: Cologuard  she will continue on her present medication regimen. She seems quite stable on that. Did encourage her to become more physically active with up to 150 minutes a week of something physical.  Physical exam - discussed and counseled on healthy lifestyle, diet, exercise, preventative care, vaccinations, sick and well care, proper use of emergency dept and after hours care, and addressed their concerns.

## 2017-01-14 ENCOUNTER — Telehealth: Payer: Self-pay | Admitting: Family Medicine

## 2017-01-14 NOTE — Telephone Encounter (Signed)
Received requested eye exam form Leighton Ruff, O.D. Sending back for review.

## 2017-01-15 ENCOUNTER — Encounter: Payer: Self-pay | Admitting: Family Medicine

## 2017-01-20 LAB — COLOGUARD: COLOGUARD: NEGATIVE

## 2017-01-21 ENCOUNTER — Telehealth: Payer: Self-pay | Admitting: Family Medicine

## 2017-01-21 NOTE — Telephone Encounter (Signed)
Received requested colonoscopy from [facility]. Sending back for review.  °

## 2017-01-31 ENCOUNTER — Telehealth: Payer: Self-pay | Admitting: Family Medicine

## 2017-01-31 NOTE — Telephone Encounter (Signed)
Received requested pap from [facility]. Sending back for review.

## 2017-02-04 ENCOUNTER — Encounter: Payer: Self-pay | Admitting: Family Medicine

## 2017-02-11 ENCOUNTER — Encounter: Payer: Self-pay | Admitting: Family Medicine

## 2017-02-11 NOTE — Progress Notes (Signed)
Called [facility] to f/u on pathology report and they report that the records from 2007 colonoscopy has been destroyed. Sending to you as Angel Ortega. [name]

## 2017-04-07 ENCOUNTER — Ambulatory Visit (INDEPENDENT_AMBULATORY_CARE_PROVIDER_SITE_OTHER): Payer: Medicare Other | Admitting: Family Medicine

## 2017-04-07 ENCOUNTER — Encounter: Payer: Self-pay | Admitting: Family Medicine

## 2017-04-07 VITALS — BP 130/71 | HR 71 | Ht 63.0 in | Wt 215.4 lb

## 2017-04-07 DIAGNOSIS — Z23 Encounter for immunization: Secondary | ICD-10-CM

## 2017-04-07 DIAGNOSIS — M549 Dorsalgia, unspecified: Secondary | ICD-10-CM

## 2017-04-07 MED ORDER — IBUPROFEN 800 MG PO TABS: 800.0000 mg | ORAL_TABLET | Freq: Three times a day (TID) | ORAL | 1 refills | Status: DC | PRN

## 2017-04-07 NOTE — Patient Instructions (Signed)
Heat for 20 minutes 3 times per day Take 800 mg of Motrin 3 times per day

## 2017-04-07 NOTE — Progress Notes (Signed)
   Subjective:    Patient ID: Angel Ortega, female    DOB: March 04, 1951, 66 y.o.   MRN: [id number]  HPI She complains of a one-week history of right CVA pain that is made worse with motions. She does not have any frequency, dysuria or urgency. No fever or chills. No recent injury but she does exercise fairly regularly.   Review of Systems     Objective:   Physical Exam No palpable tenderness to the back or CVA area. Pain on lateral motion in both directions especially to the left.       Assessment & Plan:  Musculoskeletal back pain - Plan: ibuprofen (ADVIL,MOTRIN) 800 MG tablet  Need for influenza vaccination - Plan: Flu vaccine HIGH DOSE PF (Fluzone High dose) I explained that I thought this was mainly musculoskeletal in nature. Recommend heat for 20 minutes 3 times per day and will: Ibuprofen 800 3 times a day. She will keep me informed as to her progress.

## 2017-04-11 ENCOUNTER — Telehealth: Payer: Self-pay | Admitting: Family Medicine

## 2017-04-11 NOTE — Telephone Encounter (Signed)
Pt called concerning dose of the omega 3 fish oil she is to take. She is confused on the dosing of each. How much MG's of fish oil and how much Omega 3. Please advise pt at [phone]. Do not return this to me as I am leaving early today.

## 2017-04-11 NOTE — Telephone Encounter (Signed)
Call pt please

## 2017-04-11 NOTE — Telephone Encounter (Signed)
Omega-3 and fish oil are exactly the same. So follow the directions on her bottle

## 2017-04-11 NOTE — Telephone Encounter (Signed)
Pt was notified.  

## 2017-04-24 ENCOUNTER — Telehealth: Payer: Self-pay | Admitting: Family Medicine

## 2017-04-24 MED ORDER — MUPIROCIN CALCIUM 2 % EX CREA: 1.0000 "application " | TOPICAL_CREAM | Freq: Two times a day (BID) | CUTANEOUS | 0 refills | Status: DC

## 2017-04-24 NOTE — Telephone Encounter (Signed)
Called in. Pt aware. Angel Ortega

## 2017-04-24 NOTE — Telephone Encounter (Signed)
Let her know that that medication is not for a fungus infection but for a  bacterial infection. She thinks she has a fungal infection have her use over-the-counter Lamisil AF

## 2017-04-24 NOTE — Telephone Encounter (Signed)
LMTCB

## 2017-04-24 NOTE — Telephone Encounter (Signed)
Ok to renew?  

## 2017-04-24 NOTE — Telephone Encounter (Signed)
Pt called back to the office and spoke with tammy- she informed tammy that she has the same bumps that she Dr. Tomi Bamberger for this is the cream Dr. Tomi Bamberger gave her.

## 2017-04-24 NOTE — Telephone Encounter (Signed)
Pt requesting refill on Mupirocin. She has a fungal flare up and said she was told by Dr Tomi Bamberger to keep this med on hand for flare ups.

## 2017-07-01 ENCOUNTER — Encounter: Payer: Self-pay | Admitting: Family Medicine

## 2017-07-01 ENCOUNTER — Ambulatory Visit: Payer: Medicare Other | Admitting: Family Medicine

## 2017-07-01 VITALS — BP 160/92 | HR 76 | Wt 215.4 lb

## 2017-07-01 DIAGNOSIS — M7652 Patellar tendinitis, left knee: Secondary | ICD-10-CM | POA: Diagnosis not present

## 2017-07-01 DIAGNOSIS — I10 Essential (primary) hypertension: Secondary | ICD-10-CM

## 2017-07-01 NOTE — Progress Notes (Signed)
   Subjective:    Patient ID: Angel Ortega, female    DOB: Oct 04, 1950, 67 y.o.   MRN: [id number]  HPI She is here for evaluation of left knee pain.  She did fall on her knee in August right over the patella and did fairly well after that.  She did have intermittent tingling sensation laterally since then however 1 week ago she notes pain in the posterior aspect of her knee as well as over the distal patella that is worse with physical activity.  She does not know whether it hurts worse going up or down steps.  No popping, locking or grinding.   Review of Systems     Objective:   Physical Exam Alert and in no distress.  Left knee exam shows no effusion.  No tenderness over joint line.  Medial and lateral collateral ligaments intact.  McMurray's testing negative.  Negative anterior drawer.  Tender to palpation over the inferior pole of the patella.       Assessment & Plan:  Patellar tendinitis of left knee Heat for 20 minutes 3 times per day.  Take 2 Aleve twice per day.  Do this for the next 10 days to 2 weeks.  If you keep having difficulty come back and we will reevaluate and probably get some x-rays and go further

## 2017-07-01 NOTE — Patient Instructions (Addendum)
Heat for 20 minutes 3 times per day.  Take 2 Aleve twice per day.  Do this for the next 10 days to 2 weeks.  If you keep having difficulty come back and we will reevaluate and probably get some x-rays and go further

## 2017-07-16 ENCOUNTER — Encounter: Payer: Self-pay | Admitting: Family Medicine

## 2017-07-16 ENCOUNTER — Ambulatory Visit: Payer: Medicare Other | Admitting: Family Medicine

## 2017-07-16 ENCOUNTER — Ambulatory Visit
Admission: RE | Admit: 2017-07-16 | Discharge: 2017-07-16 | Disposition: A | Discharge status: Home / Self Care | Payer: Medicare Other | Source: Ambulatory Visit | Attending: Family Medicine | Admitting: Family Medicine

## 2017-07-16 VITALS — BP 160/90 | HR 72 | Resp 16 | Wt 214.8 lb

## 2017-07-16 DIAGNOSIS — M7652 Patellar tendinitis, left knee: Secondary | ICD-10-CM

## 2017-07-16 DIAGNOSIS — I10 Essential (primary) hypertension: Secondary | ICD-10-CM

## 2017-07-16 NOTE — Progress Notes (Signed)
   Subjective:    Patient ID: DELANIE TIRRELL, female    DOB: [birth year], 67 y.o.   MRN: [id number]  HPI She is here for a recheck on her blood pressure and her knee.  She states her knee is roughly 50% better but having difficulty with posterior knee aching sensation she does occasionally hear a clicking sensation.  No popping or locking.   Review of Systems     Objective:   Physical Exam Alert and in no distress.  Blood pressure is recorded and does show her machine is 15/10 higher than compared to our cuff. Left knee exam shows slight tenderness over the inferior pole of the patella.  No tenderness over joint lines.  Negative anterior drawer and medial as well as lateral collateral ligaments.  Slight clicking sensation was appreciated with flexion but difficult to assess where.       Assessment & Plan:  Patellar tendinitis of left knee - Plan: DG Knee Complete 4 Views Left  Essential hypertension  I will get an x-ray to assess the bony structures. We will keep her on her present blood pressure medications.  Spleen the fact that the blood pressure at home is much more accurate than here in the office and did give her parameters concerning numbers that would be appropriate.

## 2017-11-26 LAB — HM MAMMOGRAPHY

## 2017-12-01 LAB — HM MAMMOGRAPHY

## 2017-12-09 ENCOUNTER — Encounter: Payer: Self-pay | Admitting: Family Medicine

## 2017-12-09 ENCOUNTER — Ambulatory Visit: Payer: Medicare Other | Admitting: Family Medicine

## 2017-12-09 VITALS — BP 130/86 | HR 74 | Temp 97.5°F | Ht 63.0 in | Wt 214.0 lb

## 2017-12-09 DIAGNOSIS — J302 Other seasonal allergic rhinitis: Secondary | ICD-10-CM

## 2017-12-09 DIAGNOSIS — E669 Obesity, unspecified: Secondary | ICD-10-CM | POA: Diagnosis not present

## 2017-12-09 DIAGNOSIS — E785 Hyperlipidemia, unspecified: Secondary | ICD-10-CM | POA: Diagnosis not present

## 2017-12-09 DIAGNOSIS — H5010 Unspecified exotropia: Secondary | ICD-10-CM | POA: Diagnosis not present

## 2017-12-09 DIAGNOSIS — R7301 Impaired fasting glucose: Secondary | ICD-10-CM | POA: Diagnosis not present

## 2017-12-09 DIAGNOSIS — I10 Essential (primary) hypertension: Secondary | ICD-10-CM

## 2017-12-09 LAB — COMPREHENSIVE METABOLIC PANEL
A/G RATIO: 1.4 (ref 1.2–2.2)
ALK PHOS: 69 IU/L (ref 39–117)
ALT: 18 IU/L (ref 0–32)
AST: 16 IU/L (ref 0–40)
Albumin: 4.1 g/dL (ref 3.6–4.8)
BILIRUBIN TOTAL: 0.5 mg/dL (ref 0.0–1.2)
BUN/Creatinine Ratio: 25 (ref 12–28)
BUN: 23 mg/dL (ref 8–27)
CHLORIDE: 104 mmol/L (ref 96–106)
CO2: 24 mmol/L (ref 20–29)
Calcium: 9.2 mg/dL (ref 8.7–10.3)
Creatinine, Ser: 0.91 mg/dL (ref 0.57–1.00)
GFR calc non Af Amer: 65 mL/min/{1.73_m2} (ref >59)
GFR, EST AFRICAN AMERICAN: 74 mL/min/{1.73_m2} (ref >59)
Globulin, Total: 2.9 g/dL (ref 1.5–4.5)
Glucose: 106 mg/dL — ABNORMAL HIGH (ref 65–99)
POTASSIUM: 3.8 mmol/L (ref 3.5–5.2)
Sodium: 142 mmol/L (ref 134–144)
TOTAL PROTEIN: 7 g/dL (ref 6.0–8.5)

## 2017-12-09 LAB — LIPID PANEL
CHOLESTEROL TOTAL: 202 mg/dL — AB (ref 100–199)
Chol/HDL Ratio: 4.9 ratio — ABNORMAL HIGH (ref 0.0–4.4)
HDL: 41 mg/dL (ref >39)
LDL Calculated: 142 mg/dL — ABNORMAL HIGH (ref 0–99)
Triglycerides: 94 mg/dL (ref 0–149)
VLDL Cholesterol Cal: 19 mg/dL (ref 5–40)

## 2017-12-09 LAB — CBC WITH DIFFERENTIAL/PLATELET
BASOS ABS: 0 10*3/uL (ref 0.0–0.2)
BASOS: 0 %
EOS (ABSOLUTE): 0.1 10*3/uL (ref 0.0–0.4)
EOS: 3 %
Hematocrit: 37.1 % (ref 34.0–46.6)
Hemoglobin: 12.2 g/dL (ref 11.1–15.9)
IMMATURE GRANULOCYTES: 0 %
Immature Grans (Abs): 0 10*3/uL (ref 0.0–0.1)
LYMPHS ABS: 1.3 10*3/uL (ref 0.7–3.1)
Lymphs: 32 %
MCH: 28.7 pg (ref 26.6–33.0)
MCHC: 32.9 g/dL (ref 31.5–35.7)
MCV: 87 fL (ref 79–97)
MONOCYTES: 7 %
MONOS ABS: 0.3 10*3/uL (ref 0.1–0.9)
Neutrophils Absolute: 2.5 10*3/uL (ref 1.4–7.0)
Neutrophils: 58 %
PLATELETS: 216 10*3/uL (ref 150–450)
RBC: 4.25 x10E6/uL (ref 3.77–5.28)
RDW: 14.8 % (ref 12.3–15.4)
WBC: 4.2 10*3/uL (ref 3.4–10.8)

## 2017-12-09 MED ORDER — MUPIROCIN CALCIUM 2 % EX CREA: 1.0000 "application " | TOPICAL_CREAM | Freq: Two times a day (BID) | CUTANEOUS | 0 refills | Status: DC

## 2017-12-09 MED ORDER — LISINOPRIL-HYDROCHLOROTHIAZIDE 20-25 MG PO TABS: 1.0000 | ORAL_TABLET | Freq: Every day | ORAL | 3 refills | Status: DC

## 2017-12-09 NOTE — Progress Notes (Signed)
Angel Ortega is a 67 y.o. female who presents for annual wellness visit and follow-up on chronic medical conditions.  She has the following concerns: She continues to have difficulty with arthritis.  She is taking Aleve for this but has concerns over long-term use.  Her allergies are under good control.  She does use Zyrtec on an as-needed basis.  She is also taking lisinopril/HCTZ for her blood pressure and having no difficulty with that.  Does have a history of impaired fasting glucose.  Immunizations and Health Maintenance Immunization History  Administered Date(s) Administered  . Influenza, High Dose Seasonal PF 05/11/2014, 03/09/2015, 05/28/2016  . Influenza,inj,Quad PF,6+ Mos 04/07/2017  . Pneumococcal Conjugate-13 12/13/2013  . Pneumococcal Polysaccharide-23 12/01/2008  . Tdap 12/01/2008  . Zoster 12/01/2008   Health Maintenance Due  Topic Date Due  . PAP SMEAR  12/26/2010  . PNA vac Low Risk Adult (2 of 2 - PPSV23) 12/14/2014  . COLONOSCOPY  08/30/2015    Last Pap smear: no longer needed Last mammogram:November 26 2017 Last colonoscopy: cologuard Last DEXA: over five years  Dentist: march 2018 Ophtho:march 2018 Exercise:walking   Other doctors caring for patient include:none  Advanced directives:no. Info given Does Patient Have a Medical Advance Directive?: No Would patient like information on creating a medical advance directive?: Yes (MAU/Ambulatory/Procedural Areas - Information given)  Depression screen:  See questionnaire below.  Depression screen [facility] 2/9 12/09/2017 01/13/2017 11/27/2015  Decreased Interest 0 0 0  Down, Depressed, Hopeless 0 0 0  PHQ - 2 Score 0 0 0    Fall Risk Screen: see questionnaire below. Fall Risk  12/09/2017 01/13/2017 11/27/2015  Falls in the past year? No No No    ADL screen:  See questionnaire below Functional Status Survey: Is the patient deaf or have difficulty hearing?: No Does the patient have difficulty concentrating, remembering,  or making decisions?: No Does the patient have difficulty walking or climbing stairs?: No Does the patient have difficulty dressing or bathing?: No Does the patient have difficulty doing errands alone such as visiting a doctor's office or shopping?: No   Review of Systems Constitutional: -, -unexpected weight change, -anorexia, -fatigue Allergy: -sneezing, -itching, -congestion Dermatology: denies changing moles, rash, lumps ENT: -runny nose, -ear pain, -sore throat,  Cardiology:  -chest pain, -palpitations, -orthopnea, Respiratory: -cough, -shortness of breath, -dyspnea on exertion, -wheezing,  Gastroenterology: -abdominal pain, -nausea, -vomiting, -diarrhea, -constipation, -dysphagia Hematology: -bleeding or bruising problems Musculoskeletal: -arthralgias, -myalgias, -joint swelling, -back pain, - Ophthalmology: -vision changes,  Urology: -dysuria, -difficulty urinating,  -urinary frequency, -urgency, incontinence Neurology: -, -numbness, , -memory loss, -falls, -dizziness    PHYSICAL EXAM:   General Appearance: Alert, cooperative, no distress, appears stated age Head: Normocephalic, without obvious abnormality, atraumatic Eyes: PERRL, conjunctiva/corneas clear, EOM's intact, fundi benign Ears: Normal TM's and external ear canals Nose: Nares normal, mucosa normal, no drainage or sinus tenderness Throat: Lips, mucosa, and tongue normal; teeth and gums normal Neck: Supple, no lymphadenopathy;  thyroid:  no enlargement/tenderness/nodules; no carotid bruit or JVD Lungs: Clear to auscultation bilaterally without wheezes, rales or ronchi; respirations unlabored Heart: Regular rate and rhythm, S1 and S2 normal, no murmur, rubor gallop Abdomen: Soft, non-tender, nondistended, normoactive bowel sounds,  no masses, no hepatosplenomegaly Extremities: No clubbing, cyanosis or edema Pulses: 2+ and symmetric all extremities Skin:  Skin color, texture, turgor normal, no rashes or  lesions Lymph nodes: Cervical, supraclavicular, and axillary nodes normal Neurologic:  CNII-XII intact, normal strength, sensation and gait; reflexes 2+ and symmetric  throughout Psych: Normal mood, affect, hygiene and grooming.  ASSESSMENT/PLAN: Essential hypertension, benign - Plan: CBC with Differential/Platelet, Comprehensive metabolic panel  Seasonal allergic rhinitis, unspecified trigger  Exotropia of left eye  Hyperlipidemia LDL goal <130 - Plan: Lipid panel  Impaired fasting glucose - Plan: Comprehensive metabolic panel  Obesity (BMI 30-39.9) - Plan: CBC with Differential/Platelet, Comprehensive metabolic panel, Lipid panel  Encouraged her to continue on her present regimen.  Discussed weight loss in regard to helping her overall health as well as with her knee.  I did go over the results of the x-ray with her.  Discussed the possibility of injections to help with her knee pain.  She will keep me informed about whether she wants this.  Mupirocin also call them if she does use this occasionally for skin infections.  Medicare Attestation I have personally reviewed: The patient's medical and social history Their use of alcohol, tobacco or illicit drugs Their current medications and supplements The patient's functional ability including ADLs,fall risks, home safety risks, cognitive, and hearing and visual impairment Diet and physical activities Evidence for depression or mood disorders  The patient's weight, height, and BMI have been recorded in the chart.  I have made referrals, counseling, and provided education to the patient based on review of the above and I have provided the patient with a written personalized care plan for preventive services.     Jill Alexanders, MD   12/09/2017

## 2017-12-09 NOTE — Patient Instructions (Addendum)
  Angel Ortega , Thank you for taking time to come for your Medicare Wellness Visit. I appreciate your ongoing commitment to your health goals. Please review the following plan we discussed and let me know if I can assist you in the future.   These are the goals we discussed: Keep doing her exercises for your knee and use Aleve.  Come back when you are ready to have a shot.   This is a list of the screening recommended for you and due dates:  Health Maintenance  Topic Date Due  . Pap Smear  12/26/2010  . Pneumonia vaccines (2 of 2 - PPSV23) 12/14/2014  . Colon Cancer Screening  08/30/2015  . Flu Shot  01/15/2018  . Tetanus Vaccine  12/02/2018  . Mammogram  12/02/2019  . DEXA scan (bone density measurement)  Completed  .  Hepatitis C: One time screening is recommended by Center for Disease Control  (CDC) for  adults born from [birth year] through 1967.   Completed

## 2017-12-26 ENCOUNTER — Telehealth: Payer: Self-pay

## 2017-12-26 NOTE — Telephone Encounter (Signed)
PA has been completed and case number is [id number]. Will get a faxed back for cost reduction with in 24 to 48 hours. [place]

## 2018-01-06 ENCOUNTER — Telehealth: Payer: Self-pay | Admitting: Family Medicine

## 2018-01-06 NOTE — Telephone Encounter (Signed)
P.A. MUPIROCIN CREAM is covered but Tiering exception denied, pt has to try a while list of covered alternatives.  I called pharmacy and cost is $64 with insurance.  Asked options and Mupirocin ointment is only $10 verses $64.   Please switch pt to Mupirocin ointment instead of the cream and send into pharmacy.

## 2018-01-07 ENCOUNTER — Telehealth: Payer: Self-pay

## 2018-01-07 MED ORDER — MUPIROCIN 2 % EX OINT: 1.0000 "application " | TOPICAL_OINTMENT | Freq: Two times a day (BID) | CUTANEOUS | 0 refills | Status: DC

## 2018-01-07 NOTE — Telephone Encounter (Signed)
Pt was called to let her know that her cream was not covered but the ointment is and pt says that she has already ben on that and it does not work Pt says she will just pay the 65.00 if and when she needs that med. [place]

## 2018-01-08 ENCOUNTER — Encounter: Payer: Self-pay | Admitting: *Deleted

## 2018-04-16 ENCOUNTER — Telehealth: Payer: Self-pay | Admitting: Family Medicine

## 2018-04-16 MED ORDER — MUPIROCIN CALCIUM 2 % EX CREA: 1.0000 "application " | TOPICAL_CREAM | Freq: Two times a day (BID) | CUTANEOUS | 2 refills | Status: DC

## 2018-04-16 NOTE — Telephone Encounter (Signed)
Pt called and is requesting a refill on her Bactroban she does not want the ointment she wants the cream instead , pt [id number] [place], [place] and pt can be reached at [phone]

## 2018-05-01 ENCOUNTER — Other Ambulatory Visit (INDEPENDENT_AMBULATORY_CARE_PROVIDER_SITE_OTHER): Payer: Medicare Other

## 2018-05-01 DIAGNOSIS — Z23 Encounter for immunization: Secondary | ICD-10-CM | POA: Diagnosis not present

## 2018-05-28 LAB — HM MAMMOGRAPHY

## 2019-01-18 ENCOUNTER — Other Ambulatory Visit: Payer: Self-pay | Admitting: Family Medicine

## 2019-01-18 DIAGNOSIS — I10 Essential (primary) hypertension: Secondary | ICD-10-CM

## 2019-01-18 NOTE — Telephone Encounter (Signed)
Called pt to advise of appt needed. [place]

## 2019-02-04 ENCOUNTER — Other Ambulatory Visit: Payer: Self-pay

## 2019-02-04 ENCOUNTER — Ambulatory Visit (INDEPENDENT_AMBULATORY_CARE_PROVIDER_SITE_OTHER): Payer: Medicare Other | Admitting: Family Medicine

## 2019-02-04 ENCOUNTER — Encounter: Payer: Self-pay | Admitting: Family Medicine

## 2019-02-04 VITALS — BP 154/92 | HR 91 | Temp 97.7°F | Ht 63.0 in | Wt 210.6 lb

## 2019-02-04 DIAGNOSIS — Z23 Encounter for immunization: Secondary | ICD-10-CM | POA: Diagnosis not present

## 2019-02-04 DIAGNOSIS — H5010 Unspecified exotropia: Secondary | ICD-10-CM

## 2019-02-04 DIAGNOSIS — J302 Other seasonal allergic rhinitis: Secondary | ICD-10-CM | POA: Diagnosis not present

## 2019-02-04 DIAGNOSIS — I10 Essential (primary) hypertension: Secondary | ICD-10-CM

## 2019-02-04 DIAGNOSIS — R7301 Impaired fasting glucose: Secondary | ICD-10-CM

## 2019-02-04 DIAGNOSIS — E669 Obesity, unspecified: Secondary | ICD-10-CM

## 2019-02-04 DIAGNOSIS — E785 Hyperlipidemia, unspecified: Secondary | ICD-10-CM

## 2019-02-04 LAB — COMPREHENSIVE METABOLIC PANEL
ALT: 23 IU/L (ref 0–32)
AST: 22 IU/L (ref 0–40)
Albumin/Globulin Ratio: 1.5 (ref 1.2–2.2)
Albumin: 4.2 g/dL (ref 3.8–4.8)
Alkaline Phosphatase: 85 IU/L (ref 39–117)
BUN/Creatinine Ratio: 28 (ref 12–28)
BUN: 25 mg/dL (ref 8–27)
Bilirubin Total: 0.2 mg/dL (ref 0.0–1.2)
CO2: 26 mmol/L (ref 20–29)
Calcium: 9.1 mg/dL (ref 8.7–10.3)
Chloride: 104 mmol/L (ref 96–106)
Creatinine, Ser: 0.9 mg/dL (ref 0.57–1.00)
GFR calc Af Amer: 75 mL/min/{1.73_m2} (ref >59)
GFR calc non Af Amer: 65 mL/min/{1.73_m2} (ref >59)
Globulin, Total: 2.8 g/dL (ref 1.5–4.5)
Glucose: 118 mg/dL — ABNORMAL HIGH (ref 65–99)
Potassium: 4 mmol/L (ref 3.5–5.2)
Sodium: 143 mmol/L (ref 134–144)
Total Protein: 7 g/dL (ref 6.0–8.5)

## 2019-02-04 LAB — CBC WITH DIFFERENTIAL/PLATELET
Basophils Absolute: 0 10*3/uL (ref 0.0–0.2)
Basos: 1 %
EOS (ABSOLUTE): 0.1 10*3/uL (ref 0.0–0.4)
Eos: 3 %
Hematocrit: 38.2 % (ref 34.0–46.6)
Hemoglobin: 12.5 g/dL (ref 11.1–15.9)
Immature Grans (Abs): 0 10*3/uL (ref 0.0–0.1)
Immature Granulocytes: 0 %
Lymphocytes Absolute: 1.4 10*3/uL (ref 0.7–3.1)
Lymphs: 29 %
MCH: 28.7 pg (ref 26.6–33.0)
MCHC: 32.7 g/dL (ref 31.5–35.7)
MCV: 88 fL (ref 79–97)
Monocytes Absolute: 0.4 10*3/uL (ref 0.1–0.9)
Monocytes: 8 %
Neutrophils Absolute: 2.9 10*3/uL (ref 1.4–7.0)
Neutrophils: 59 %
Platelets: 213 10*3/uL (ref 150–450)
RBC: 4.35 x10E6/uL (ref 3.77–5.28)
RDW: 13.2 % (ref 11.7–15.4)
WBC: 4.9 10*3/uL (ref 3.4–10.8)

## 2019-02-04 LAB — LIPID PANEL
Chol/HDL Ratio: 5 ratio — ABNORMAL HIGH (ref 0.0–4.4)
Cholesterol, Total: 211 mg/dL — ABNORMAL HIGH (ref 100–199)
HDL: 42 mg/dL (ref >39)
LDL Calculated: 155 mg/dL — ABNORMAL HIGH (ref 0–99)
Triglycerides: 71 mg/dL (ref 0–149)
VLDL Cholesterol Cal: 14 mg/dL (ref 5–40)

## 2019-02-04 MED ORDER — MUPIROCIN CALCIUM 2 % EX CREA: 1.0000 "application " | TOPICAL_CREAM | Freq: Two times a day (BID) | CUTANEOUS | 2 refills | Status: DC

## 2019-02-04 MED ORDER — LISINOPRIL-HYDROCHLOROTHIAZIDE 20-25 MG PO TABS: 1.0000 | ORAL_TABLET | Freq: Every day | ORAL | 3 refills | Status: DC

## 2019-02-04 NOTE — Progress Notes (Signed)
Angel Ortega is a 68 y.o. female who presents for annual wellness visit and follow-up on chronic medical conditions.  She continues on her blood pressure medication without difficulty.  She does occasionally use ibuprofen for aches and pains as well as Tylenol.  She does have underlying allergies and does use OTC medications for this.  She also uses Bactroban on an as-needed basis for various skin problems. She walks daily.  Her exercise is really unchanged.  Discussed her yearly mammograms talked about exercise Immunizations and Health Maintenance Immunization History  Administered Date(s) Administered  . Influenza, High Dose Seasonal PF 05/11/2014, 03/09/2015, 05/28/2016, 05/01/2018  . Influenza,inj,Quad PF,6+ Mos 04/07/2017  . Pneumococcal Conjugate-13 12/13/2013  . Pneumococcal Polysaccharide-23 12/01/2008  . Tdap 12/01/2008  . Zoster 12/01/2008   Health Maintenance Due  Topic Date Due  . PAP SMEAR-Modifier  12/26/2010  . TETANUS/TDAP  12/02/2018  . INFLUENZA VACCINE  01/16/2019    Last Pap smear:12-22-09 Last mammogram:05-28-18 Last colonoscopy:01/21/15 Last DEXA:01-09-09 Dentist: 2019 Ophtho: [id number]" Exercise: walking QD  Other doctors caring for patient include: N/A  Advanced directives:none on file working on paper work    Depression screen:  See questionnaire below.  Depression screen [facility] 2/9 12/09/2017 01/13/2017 11/27/2015  Decreased Interest 0 0 0  Down, Depressed, Hopeless 0 0 0  PHQ - 2 Score 0 0 0    Fall Risk Screen: see questionnaire below. Fall Risk  12/09/2017 01/13/2017 11/27/2015  Falls in the past year? No No No    ADL screen:  See questionnaire below Functional Status Survey:     Review of Systems Constitutional: -, -unexpected weight change, -anorexia, -fatigue Allergy: -sneezing, -itching, -congestion Dermatology: denies changing moles, rash, lumps  Cardiology:  -chest pain, -palpitations, -orthopnea, Respiratory: -cough, -shortness of  breath, -dyspnea on exertion, -wheezing,  Gastroenterology: -abdominal pain, -nausea, -vomiting, -diarrhea, -constipation, -dysphagia Hematology: -bleeding or bruising problems Musculoskeletal: -arthralgias, -myalgias, -joint swelling, -back pain, - Ophthalmology: -vision changes,  Urology: -dysuria, -difficulty urinating,  -urinary frequency, -urgency, incontinence Neurology: -, -numbness, , -memory loss, -falls, -dizziness    PHYSICAL EXAM:   General Appearance: Alert, cooperative, no distress, appears stated age Head: Normocephalic, without obvious abnormality, atraumatic Eyes: PERRL, conjunctiva/corneas clear, EOM's intact, fundi benign.  Exotropia noted of left eye. Ears: Normal TM's and external ear canals Nose: Nares normal, mucosa normal, no drainage or sinus tenderness Throat: Lips, mucosa, and tongue normal; teeth and gums normal Neck: Supple, no lymphadenopathy;  thyroid:  no enlargement/tenderness/nodules; no carotid bruit or JVD Lungs: Clear to auscultation bilaterally without wheezes, rales or ronchi; respirations unlabored Heart: Regular rate and rhythm, S1 and S2 normal, no murmur, rubor gallop Abdomen: Soft, non-tender, nondistended, normoactive bowel sounds,  no masses, no hepatosplenomegaly  Pulses: 2+ and symmetric all extremities Skin:  Skin color, texture, turgor normal, no rashes or lesions Lymph nodes: Cervical, supraclavicular, and axillary nodes normal Neurologic:  CNII-XII intact, normal strength, sensation and gait; reflexes 2+ and symmetric throughout Psych: Normal mood, affect, hygiene and grooming.  ASSESSMENT/PLAN: Essential hypertension, benign - Plan: CBC with Differential/Platelet, Comprehensive metabolic panel  Need for influenza vaccination - Plan: Flu Vaccine QUAD High Dose(Fluad)  Seasonal allergic rhinitis, unspecified trigger  Exotropia of left eye  Impaired fasting glucose  Obesity (BMI 30-39.9) - Plan: CBC with  Differential/Platelet, Comprehensive metabolic panel, Lipid panel  Hyperlipidemia LDL goal <130 - Plan: Lipid panel Continue to use the antibiotic ointment as needed for the skin problems. Discussed  yearly mammograms; at least 30 minutes of aerobic activity  at least 5 days/week and weight-bearing exercise 2x/week; \ healthy diet, including goals of calcium and vitamin D intake and alcohol recommendations (less than or equal to 1 drink/day) reviewed; regular seatbelt use; changing batteries in smoke detectors.  Immunization recommendations discussed.  Colonoscopy recommendations reviewed   Medicare Attestation I have personally reviewed: The patient's medical and social history Their use of alcohol, tobacco or illicit drugs Their current medications and supplements The patient's functional ability including ADLs,fall risks, home safety risks, cognitive, and hearing and visual impairment Diet and physical activities Evidence for depression or mood disorders Her blood pressure is elevated.  I will have her bring her blood pressure cuff in and measure it against ours as there is a possibility she has whitecoat hypertension.  The patient's weight, height, and BMI have been recorded in the chart.  I have made referrals, counseling, and provided education to the patient based on review of the above and I have provided the patient with a written personalized care plan for preventive services.     Jill Alexanders, MD   02/04/2019

## 2019-02-04 NOTE — Patient Instructions (Addendum)
Brand name multivitamin like Centrum or Theragran  Angel Ortega , Thank you for taking time to come for your Medicare Wellness Visit. I appreciate your ongoing commitment to your health goals. Please review the following plan we discussed and let me know if I can assist you in the future.   These are the goals we discussed: Bring your blood pressure cuff back and measure it against ours and we will use your blood pressure readings if it is accurate This is a list of the screening recommended for you and due dates:  Health Maintenance  Topic Date Due  . Tetanus Vaccine  12/02/2018  . Flu Shot  01/16/2019  . Cologuard (Stool DNA test)  01/21/2020  . Mammogram  05/28/2020  . DEXA scan (bone density measurement)  Completed  .  Hepatitis C: One time screening is recommended by Center for Disease Control  (CDC) for  adults born from [birth year] through 1967.   Completed  . Pap Smear  Discontinued  . Pneumonia vaccines  Discontinued

## 2019-02-09 ENCOUNTER — Other Ambulatory Visit: Payer: Self-pay

## 2019-02-09 ENCOUNTER — Other Ambulatory Visit: Payer: Medicare Other

## 2019-02-10 LAB — SPECIMEN STATUS REPORT

## 2019-02-10 LAB — HGB A1C W/O EAG: Hgb A1c MFr Bld: 6.1 % — ABNORMAL HIGH (ref 4.8–5.6)

## 2019-05-03 ENCOUNTER — Telehealth: Payer: Self-pay | Admitting: Family Medicine

## 2019-05-03 DIAGNOSIS — M549 Dorsalgia, unspecified: Secondary | ICD-10-CM

## 2019-05-03 MED ORDER — IBUPROFEN 800 MG PO TABS: 800.0000 mg | ORAL_TABLET | Freq: Three times a day (TID) | ORAL | 1 refills | Status: DC | PRN

## 2019-05-03 NOTE — Telephone Encounter (Signed)
Pt left message to refill 800 mg of motrin

## 2019-06-16 ENCOUNTER — Other Ambulatory Visit: Payer: Self-pay

## 2019-06-16 ENCOUNTER — Telehealth: Payer: Self-pay | Admitting: Family Medicine

## 2019-06-16 DIAGNOSIS — I10 Essential (primary) hypertension: Secondary | ICD-10-CM

## 2019-06-16 MED ORDER — LISINOPRIL-HYDROCHLOROTHIAZIDE 20-25 MG PO TABS: 1.0000 | ORAL_TABLET | Freq: Every day | ORAL | 3 refills | Status: DC

## 2019-06-16 NOTE — Telephone Encounter (Signed)
Refill for Lisinopril come in needs to go to new [facility] mail order please send

## 2019-06-16 NOTE — Telephone Encounter (Signed)
Done KH 

## 2019-06-25 ENCOUNTER — Other Ambulatory Visit: Payer: Self-pay | Admitting: Family Medicine

## 2019-06-25 DIAGNOSIS — I10 Essential (primary) hypertension: Secondary | ICD-10-CM

## 2019-06-25 MED ORDER — LISINOPRIL-HYDROCHLOROTHIAZIDE 20-25 MG PO TABS: 1.0000 | ORAL_TABLET | Freq: Every day | ORAL | 3 refills | Status: DC

## 2019-10-26 ENCOUNTER — Telehealth: Payer: Self-pay | Admitting: Family Medicine

## 2019-10-26 MED ORDER — MUPIROCIN CALCIUM 2 % EX CREA: 1.0000 "application " | TOPICAL_CREAM | Freq: Two times a day (BID) | CUTANEOUS | 2 refills | Status: DC

## 2019-10-26 NOTE — Telephone Encounter (Signed)
Pt called for refills of mupirocin cream. [facility] [address]. Pt can be reached at [phone].

## 2019-12-01 DIAGNOSIS — Z1231 Encounter for screening mammogram for malignant neoplasm of breast: Secondary | ICD-10-CM | POA: Diagnosis not present

## 2019-12-01 LAB — HM MAMMOGRAPHY

## 2019-12-07 ENCOUNTER — Encounter: Payer: Self-pay | Admitting: Family Medicine

## 2020-02-07 ENCOUNTER — Ambulatory Visit (INDEPENDENT_AMBULATORY_CARE_PROVIDER_SITE_OTHER): Payer: Medicare PPO | Admitting: Family Medicine

## 2020-02-07 ENCOUNTER — Encounter: Payer: Self-pay | Admitting: Family Medicine

## 2020-02-07 ENCOUNTER — Other Ambulatory Visit: Payer: Self-pay

## 2020-02-07 VITALS — BP 128/82 | HR 80 | Temp 97.3°F | Ht 62.75 in | Wt 215.0 lb

## 2020-02-07 DIAGNOSIS — I10 Essential (primary) hypertension: Secondary | ICD-10-CM

## 2020-02-07 DIAGNOSIS — Z13820 Encounter for screening for osteoporosis: Secondary | ICD-10-CM | POA: Diagnosis not present

## 2020-02-07 DIAGNOSIS — Z1211 Encounter for screening for malignant neoplasm of colon: Secondary | ICD-10-CM

## 2020-02-07 DIAGNOSIS — Z0000 Encounter for general adult medical examination without abnormal findings: Secondary | ICD-10-CM | POA: Diagnosis not present

## 2020-02-07 DIAGNOSIS — M1990 Unspecified osteoarthritis, unspecified site: Secondary | ICD-10-CM

## 2020-02-07 DIAGNOSIS — E2839 Other primary ovarian failure: Secondary | ICD-10-CM

## 2020-02-07 DIAGNOSIS — J302 Other seasonal allergic rhinitis: Secondary | ICD-10-CM | POA: Diagnosis not present

## 2020-02-07 DIAGNOSIS — H5010 Unspecified exotropia: Secondary | ICD-10-CM | POA: Diagnosis not present

## 2020-02-07 DIAGNOSIS — E785 Hyperlipidemia, unspecified: Secondary | ICD-10-CM

## 2020-02-07 DIAGNOSIS — E669 Obesity, unspecified: Secondary | ICD-10-CM

## 2020-02-07 DIAGNOSIS — R7301 Impaired fasting glucose: Secondary | ICD-10-CM | POA: Diagnosis not present

## 2020-02-07 LAB — POCT GLYCOSYLATED HEMOGLOBIN (HGB A1C): Hemoglobin A1C: 6.3 % — AB (ref 4.0–5.6)

## 2020-02-07 MED ORDER — IBUPROFEN 800 MG PO TABS: 800.0000 mg | ORAL_TABLET | Freq: Three times a day (TID) | ORAL | 1 refills | Status: DC | PRN

## 2020-02-07 MED ORDER — LISINOPRIL-HYDROCHLOROTHIAZIDE 20-25 MG PO TABS: 1.0000 | ORAL_TABLET | Freq: Every day | ORAL | 3 refills | Status: DC

## 2020-02-07 NOTE — Progress Notes (Signed)
Angel Ortega is a 69 y.o. female who presents for annual wellness visit,CPE and follow-up on chronic medical conditions.  She has concern over possible urinary tract infection but at present time is really having no troubles.  She also does have occasional difficulty with leg cramps.  She would like a refill on her ibuprofen that she uses for arthritis.  She does complain of some bilateral knee pain and does have a previous history of x-ray showing some degenerative changes.  Review of the record indicates she needs Shingrix, Tdap and will need to Covid booster.  Presently she is taking lisinopril/HCTZ and various OTC medications.  She does have a history of impaired fasting glucose.  She does not exercise regularly.  Her weight has been relatively stable.  She does have underlying allergies and they are controlled with the occasional use of Zyrtec Immunizations and Health Maintenance Immunization History  Administered Date(s) Administered  . Fluad Quad(high Dose 65+) 02/04/2019  . Influenza, High Dose Seasonal PF 05/11/2014, 03/09/2015, 05/28/2016, 05/01/2018  . Influenza,inj,Quad PF,6+ Mos 04/07/2017  . Pneumococcal Conjugate-13 12/13/2013  . Pneumococcal Polysaccharide-23 12/01/2008  . Tdap 12/01/2008  . Zoster 12/01/2008   Health Maintenance Due  Topic Date Due  . COVID-19 Vaccine (1) Never done  . TETANUS/TDAP  12/02/2018  . Fecal DNA (Cologuard)  01/21/2020  . INFLUENZA VACCINE  01/16/2020    Last Pap smear: hysterectomy Last mammogram: 12/01/2019 Last colonoscopy: not sure last one did a cologuard Last DEXA: a few years ago Dentist: Dr. Posey Pronto Ophtho: Dr. Fredderick [place]ix Exercise: walks every other day  Other doctors caring for patient include: none  Advanced directives: not yet but will get it filled out Does Patient Have a Medical Advance Directive?: No Would patient like information on creating a medical advance directive?: Yes (MAU/Ambulatory/Procedural Areas - Information  given)  Depression screen:  See questionnaire below.  Depression screen [facility] 2/9 02/07/2020 02/04/2019 12/09/2017 01/13/2017 11/27/2015  Decreased Interest 0 0 0 0 0  Down, Depressed, Hopeless 0 0 0 0 0  PHQ - 2 Score 0 0 0 0 0    Fall Risk Screen: see questionnaire below. Fall Risk  02/07/2020 02/04/2019 12/09/2017 01/13/2017 11/27/2015  Falls in the past year? 0 0 No No No  Number falls in past yr: 0 - - - -  Injury with Fall? 0 - - - -    ADL screen:  See questionnaire below Functional Status Survey: Is the patient deaf or have difficulty hearing?: No Does the patient have difficulty seeing, even when wearing glasses/contacts?: No Does the patient have difficulty concentrating, remembering, or making decisions?: No Does the patient have difficulty walking or climbing stairs?: No Does the patient have difficulty dressing or bathing?: No Does the patient have difficulty doing errands alone such as visiting a doctor's office or shopping?: No   Review of Systems Constitutional: -, -unexpected weight change, -anorexia, -fatigue Allergy: -sneezing, -itching, -congestion Dermatology: denies changing moles, rash, lumps ENT: -runny nose, -ear pain, -sore throat,  Cardiology:  -chest pain, -palpitations, -orthopnea, Respiratory: -cough, -shortness of breath, -dyspnea on exertion, -wheezing,  Gastroenterology: -abdominal pain, -nausea, -vomiting, -diarrhea, -constipation, -dysphagia Hematology: -bleeding or bruising problems Musculoskeletal: -arthralgias, -myalgias, -joint swelling, -back pain, - Ophthalmology: -vision changes,  Urology: -dysuria, -difficulty urinating,  -urinary frequency, -urgency, incontinence Neurology: -, -numbness, , -memory loss, -falls, -dizziness    PHYSICAL EXAM:  BP 128/82   Pulse 80   Temp (!) 97.3 F (36.3 C)   Ht 5' 2.75" (1.594 m)  Wt 215 lb (97.5 kg)   LMP  (LMP Unknown)   BMI 38.39 kg/m   General Appearance: Alert, cooperative, no distress,  appears stated age Head: Normocephalic, without obvious abnormality, atraumatic Eyes: PERRL, conjunctiva/corneas clear, EOM's intact, esotropia of right eye noted Ears: Normal TM's and external ear canals Nose: Nares normal, mucosa normal, no drainage or sinus tenderness Throat: Lips, mucosa, and tongue normal; teeth and gums normal Neck: Supple, no lymphadenopathy;  thyroid:  no enlargement/tenderness/nodules; no carotid bruit or JVD Lungs: Clear to auscultation bilaterally without wheezes, rales or ronchi; respirations unlabored Heart: Regular rate and rhythm, S1 and S2 normal, no murmur, rubor gallop Abdomen: Soft, non-tender, nondistended, normoactive bowel sounds,  no masses, no hepatosplenomegaly Extremities: No clubbing, cyanosis or edema Pulses: 2+ and symmetric all extremities Skin:  Skin color, texture, turgor normal, no rashes or lesions Lymph nodes: Cervical, supraclavicular, and axillary nodes normal Neurologic:  CNII-XII intact, normal strength, sensation and gait; reflexes 2+ and symmetric throughout Psych: Normal mood, affect, hygiene and grooming. Hemoglobin A1c is 6.3 ASSESSMENT/PLAN: Routine general medical examination at a health care facility  Essential hypertension - Plan: lisinopril-hydrochlorothiazide (ZESTORETIC) 20-25 MG tablet  Seasonal allergic rhinitis, unspecified trigger  Essential hypertension, benign - Plan: CBC with Differential/Platelet, Comprehensive metabolic panel  Exotropia of left eye  Obesity (BMI 30-39.9)  Hyperlipidemia LDL goal <130 - Plan: Lipid panel  Impaired fasting glucose - Plan: HgB A1c  Screening for colon cancer - Plan: Cologuard  Screening for osteoporosis - Plan: DG Bone Density  Arthritis - Plan: ibuprofen (ADVIL) 800 MG tablet  Estrogen deficiency - Plan: DG Bone Density She will continue on her present medication regimen.  Discussed the fact that her hemoglobin A1c has actually gone up.  Discussed in detail diet and  exercise in regard to 20 minutes of something physical daily 450 minutes a week.  Also discussed cutting back on carbohydrates with specific instructions on that. She is to get Shingrix, Tdap and at the appropriate time get booster at her drugstore.  Continue to maintain hydration as well as a multivitamin and using Tylenol and Motrin for her pains specifically in her knees.  Immunization recommendations discussed.  Colonoscopy recommendations reviewed   Medicare Attestation I have personally reviewed: The patient's medical and social history Their use of alcohol, tobacco or illicit drugs Their current medications and supplements The patient's functional ability including ADLs,fall risks, home safety risks, cognitive, and hearing and visual impairment Diet and physical activities Evidence for depression or mood disorders  The patient's weight, height, and BMI have been recorded in the chart.  I have made referrals, counseling, and provided education to the patient based on review of the above and I have provided the patient with a written personalized care plan for preventive services.     Jill Alexanders, MD   02/07/2020

## 2020-02-07 NOTE — Patient Instructions (Signed)
  Angel Ortega , Thank you for taking time to come for your Medicare Wellness Visit. I appreciate your ongoing commitment to your health goals. Please review the following plan we discussed and let me know if I can assist you in the future.   These are the goals we discussed: Set a goal dress size of 12.  Work on 20 minutes of something physical daily even if you have to divide it up.  Look at your eating habits in terms of cutting back on carbohydrates.  Bread, rice, pasta, potatoes and sugar.  Easiest way to remember is that is [place] food This is a list of the screening recommended for you and due dates:  Health Maintenance  Topic Date Due  . COVID-19 Vaccine (1) Never done  . Tetanus Vaccine  12/02/2018  . Cologuard (Stool DNA test)  01/21/2020  . Flu Shot  01/16/2020  . Mammogram  11/30/2021  . DEXA scan (bone density measurement)  Completed  .  Hepatitis C: One time screening is recommended by Center for Disease Control  (CDC) for  adults born from [birth year] through 1967.   Completed  . Pap Smear  Discontinued  . Pneumonia vaccines  Discontinued

## 2020-02-08 LAB — COMPREHENSIVE METABOLIC PANEL
ALT: 19 IU/L (ref 0–32)
AST: 19 IU/L (ref 0–40)
Albumin/Globulin Ratio: 1.4 (ref 1.2–2.2)
Albumin: 4.2 g/dL (ref 3.7–4.7)
Alkaline Phosphatase: 78 IU/L (ref 48–121)
BUN/Creatinine Ratio: 17 (ref 12–28)
BUN: 16 mg/dL (ref 8–27)
Bilirubin Total: 0.5 mg/dL (ref 0.0–1.2)
CO2: 28 mmol/L (ref 20–29)
Calcium: 9.2 mg/dL (ref 8.7–10.3)
Chloride: 104 mmol/L (ref 96–106)
Creatinine, Ser: 0.96 mg/dL (ref 0.57–1.00)
GFR calc Af Amer: 69 mL/min/{1.73_m2} (ref >59)
GFR calc non Af Amer: 60 mL/min/{1.73_m2} (ref >59)
Globulin, Total: 3 g/dL (ref 1.5–4.5)
Glucose: 113 mg/dL — ABNORMAL HIGH (ref 65–99)
Potassium: 4.2 mmol/L (ref 3.5–5.2)
Sodium: 143 mmol/L (ref 134–144)
Total Protein: 7.2 g/dL (ref 6.0–8.5)

## 2020-02-08 LAB — CBC WITH DIFFERENTIAL/PLATELET
Basophils Absolute: 0 10*3/uL (ref 0.0–0.2)
Basos: 0 %
EOS (ABSOLUTE): 0.1 10*3/uL (ref 0.0–0.4)
Eos: 3 %
Hematocrit: 39.7 % (ref 34.0–46.6)
Hemoglobin: 12.7 g/dL (ref 11.1–15.9)
Immature Grans (Abs): 0 10*3/uL (ref 0.0–0.1)
Immature Granulocytes: 0 %
Lymphocytes Absolute: 1 10*3/uL (ref 0.7–3.1)
Lymphs: 22 %
MCH: 29 pg (ref 26.6–33.0)
MCHC: 32 g/dL (ref 31.5–35.7)
MCV: 91 fL (ref 79–97)
Monocytes Absolute: 0.5 10*3/uL (ref 0.1–0.9)
Monocytes: 12 %
Neutrophils Absolute: 2.8 10*3/uL (ref 1.4–7.0)
Neutrophils: 63 %
Platelets: 184 10*3/uL (ref 150–450)
RBC: 4.38 x10E6/uL (ref 3.77–5.28)
RDW: 13.8 % (ref 11.7–15.4)
WBC: 4.5 10*3/uL (ref 3.4–10.8)

## 2020-02-08 LAB — LIPID PANEL
Chol/HDL Ratio: 4.8 ratio — ABNORMAL HIGH (ref 0.0–4.4)
Cholesterol, Total: 206 mg/dL — ABNORMAL HIGH (ref 100–199)
HDL: 43 mg/dL (ref >39)
LDL Chol Calc (NIH): 146 mg/dL — ABNORMAL HIGH (ref 0–99)
Triglycerides: 93 mg/dL (ref 0–149)
VLDL Cholesterol Cal: 17 mg/dL (ref 5–40)

## 2020-02-09 ENCOUNTER — Telehealth: Payer: Self-pay

## 2020-02-09 ENCOUNTER — Encounter: Payer: Self-pay | Admitting: Family Medicine

## 2020-02-09 NOTE — Telephone Encounter (Signed)
LVM for pt to call back to advise of her new DX of Glucose intolerance. [place]

## 2020-02-09 NOTE — Progress Notes (Signed)
The blood work is negative except for evidence of glucose intolerance.

## 2020-02-10 ENCOUNTER — Telehealth: Payer: Self-pay

## 2020-02-10 NOTE — Telephone Encounter (Signed)
Pt was advised of labs. KH 

## 2020-02-14 DIAGNOSIS — Z1211 Encounter for screening for malignant neoplasm of colon: Secondary | ICD-10-CM | POA: Diagnosis not present

## 2020-02-14 LAB — COLOGUARD: Cologuard: NEGATIVE

## 2020-02-15 LAB — COLOGUARD

## 2020-02-16 LAB — HGB A1C W/O EAG: Hgb A1c MFr Bld: 6.2 % — ABNORMAL HIGH (ref 4.8–5.6)

## 2020-02-16 LAB — SPECIMEN STATUS REPORT

## 2020-02-19 LAB — COLOGUARD: COLOGUARD: NEGATIVE

## 2020-02-25 NOTE — Progress Notes (Signed)
lvm FOR PT TO CALL BACK FOR COLOGUARD RESULTS .[place]

## 2020-03-06 ENCOUNTER — Other Ambulatory Visit (INDEPENDENT_AMBULATORY_CARE_PROVIDER_SITE_OTHER): Payer: Medicare PPO

## 2020-03-06 ENCOUNTER — Other Ambulatory Visit: Payer: Self-pay

## 2020-03-06 DIAGNOSIS — Z23 Encounter for immunization: Secondary | ICD-10-CM

## 2020-03-08 ENCOUNTER — Encounter: Payer: Self-pay | Admitting: Family Medicine

## 2020-04-03 ENCOUNTER — Other Ambulatory Visit: Payer: Self-pay

## 2020-04-03 ENCOUNTER — Ambulatory Visit (INDEPENDENT_AMBULATORY_CARE_PROVIDER_SITE_OTHER): Payer: Medicare Other

## 2020-04-03 DIAGNOSIS — Z23 Encounter for immunization: Secondary | ICD-10-CM | POA: Diagnosis not present

## 2020-05-15 ENCOUNTER — Telehealth: Payer: Medicare PPO | Admitting: Family Medicine

## 2020-05-25 ENCOUNTER — Ambulatory Visit: Payer: Medicare PPO | Admitting: Family Medicine

## 2020-05-25 ENCOUNTER — Other Ambulatory Visit: Payer: Self-pay

## 2020-05-25 ENCOUNTER — Encounter: Payer: Self-pay | Admitting: Family Medicine

## 2020-05-25 VITALS — BP 192/80 | HR 99 | Ht 62.75 in | Wt 213.0 lb

## 2020-05-25 DIAGNOSIS — B09 Unspecified viral infection characterized by skin and mucous membrane lesions: Secondary | ICD-10-CM

## 2020-05-25 NOTE — Progress Notes (Signed)
   Subjective:    Patient ID: Angel Ortega, female    DOB: [birth year], 69 y.o.   MRN: [id number]  HPI She states that around Thanksgiving, she developed sore throat, cough and congestion and shortly after that noted peeling of the hands and feet.  Presently she is having no cough, congestion, sore throat  Review of Systems     Objective:   Physical Exam Alert and in no distress.  Exam of the palms and soles does show scaling and flaking of the skin.       Assessment & Plan:  Viral exanthem Explained that this is probably a post inflammation problem and should go away on its own.  If she is still having difficulty in about 2 weeks, she will call and I will refer for dermatology evaluation.  She was comfortable with that.

## 2020-05-31 ENCOUNTER — Ambulatory Visit
Admission: RE | Admit: 2020-05-31 | Discharge: 2020-05-31 | Disposition: A | Discharge status: Home / Self Care | Payer: Medicare PPO | Source: Ambulatory Visit | Attending: Family Medicine | Admitting: Family Medicine

## 2020-05-31 ENCOUNTER — Other Ambulatory Visit: Payer: Self-pay

## 2020-05-31 DIAGNOSIS — Z780 Asymptomatic menopausal state: Secondary | ICD-10-CM | POA: Diagnosis not present

## 2020-05-31 DIAGNOSIS — Z13820 Encounter for screening for osteoporosis: Secondary | ICD-10-CM | POA: Diagnosis not present

## 2020-07-31 ENCOUNTER — Other Ambulatory Visit: Payer: Self-pay | Admitting: Family Medicine

## 2020-07-31 DIAGNOSIS — I10 Essential (primary) hypertension: Secondary | ICD-10-CM

## 2020-08-23 ENCOUNTER — Telehealth: Payer: Self-pay

## 2020-08-23 NOTE — Telephone Encounter (Signed)
Pt. Called stating that she saw Dr. Redmond School back in Dec. For her skin peeling on her hands and feet she was told to call back if it happens again and she could be referred to Dermatology. She said it is happening again and would like a referral to dermatology.

## 2020-08-24 ENCOUNTER — Other Ambulatory Visit: Payer: Self-pay

## 2020-08-24 DIAGNOSIS — R239 Unspecified skin changes: Secondary | ICD-10-CM

## 2020-08-24 NOTE — Telephone Encounter (Signed)
ok 

## 2020-08-24 NOTE — Telephone Encounter (Signed)
Done KH 

## 2020-08-24 NOTE — Telephone Encounter (Signed)
Please advise if this is ok. [place]

## 2020-08-28 DIAGNOSIS — L853 Xerosis cutis: Secondary | ICD-10-CM | POA: Diagnosis not present

## 2020-09-21 ENCOUNTER — Telehealth: Payer: Self-pay | Admitting: Family Medicine

## 2020-09-21 NOTE — Telephone Encounter (Signed)
Pt called and wants to know if you think she needs to get the 4th covid booster she had her last one on October and it was phizer. But she says if u dont think she needs it she dont want to get it,   Pt can be reached at [phone]

## 2020-09-21 NOTE — Telephone Encounter (Signed)
She can get it after 6 months from her last dosing.

## 2020-09-22 NOTE — Telephone Encounter (Signed)
Pt called back and wanted to see if you recommend her getting the the 2nd booster

## 2020-09-22 NOTE — Telephone Encounter (Signed)
Yes she can get it when she reaches a 9-month timeframe.

## 2020-11-07 ENCOUNTER — Telehealth: Payer: Self-pay

## 2020-11-07 DIAGNOSIS — M1990 Unspecified osteoarthritis, unspecified site: Secondary | ICD-10-CM

## 2020-11-07 MED ORDER — IBUPROFEN 800 MG PO TABS: 800.0000 mg | ORAL_TABLET | Freq: Three times a day (TID) | ORAL | 1 refills | Status: DC | PRN

## 2020-11-07 NOTE — Telephone Encounter (Signed)
Pt. Called stating she wanted to know if you could send in a refill for her 800mg  ibuprofen to the [facility] on [address] Pt. Last apt was 05/25/20 and next apt is 02/07/21.

## 2020-12-07 ENCOUNTER — Telehealth: Payer: Medicare PPO | Admitting: Family Medicine

## 2020-12-07 ENCOUNTER — Encounter: Payer: Self-pay | Admitting: Family Medicine

## 2020-12-07 ENCOUNTER — Other Ambulatory Visit (INDEPENDENT_AMBULATORY_CARE_PROVIDER_SITE_OTHER): Payer: Medicare PPO

## 2020-12-07 VITALS — BP 129/69 | Temp 99.4°F | Ht 62.75 in | Wt 215.0 lb

## 2020-12-07 DIAGNOSIS — R509 Fever, unspecified: Secondary | ICD-10-CM

## 2020-12-07 DIAGNOSIS — J3489 Other specified disorders of nose and nasal sinuses: Secondary | ICD-10-CM | POA: Diagnosis not present

## 2020-12-07 DIAGNOSIS — R059 Cough, unspecified: Secondary | ICD-10-CM

## 2020-12-07 LAB — POC COVID19 BINAXNOW: SARS Coronavirus 2 Ag: NEGATIVE

## 2020-12-07 NOTE — Progress Notes (Signed)
Start time: 9:34 End time: 9:57  Virtual Visit via Telephone Note  I connected with Angel Ortega on 12/07/20 by telephone and verified that I am speaking with the correct person using two identifiers. She did not have access to do a video visit.  Location: Patient: home Provider: office   I discussed the limitations of evaluation and management by telemedicine and the availability of in person appointments. The patient expressed understanding and agreed to proceed.  History of Present Illness:  Chief Complaint  Patient presents with   Facial Pain    PHONE CALL sinus pain and pressure, when she turns her head it throbs. Eyes feels puffy. No cough or chest congestion, no fever. Symptoms started Tues night. When she lays down and turns her head side to side it is worst. No home covid tests.    2 nights ago she felt "swimmy headed"--felt "bubbly" when she turned her head from side to side. Yesterday that was better, but last night, when she laid down and turned her head from side to side, her head was throbbing, and when she looked down there was also throbbing.  The throbbing is in her forehead when looking down, and in her temples when on her side.  Denies any pain in her cheeks or teeth.  Denies nasal congestion, no runny nose, no sore throat, no postnasal drainage. Denies cough, body aches. Normal balance. Denies vertigo. No throbbing when she is sitting.  Eyes feel a little sore, heavy. She took Advil cold and sinus, last dose last night. Didn't help  She reports having had 3 COVID vaccines, last was 04/03/2020  No sick contacts. No chills, but low grade fever noted this morning. No loss of taste or smell. No n/v/d   PMH, PSH, SH reviewed:  Outpatient Encounter Medications as of 12/07/2020  Medication Sig Note   CLINPRO 5000 1.1 % PSTE     ibuprofen (ADVIL) 800 MG tablet Take 1 tablet (800 mg total) by mouth every 8 (eight) hours as needed for moderate pain. 12/07/2020: Took  yesterday   lisinopril-hydrochlorothiazide (ZESTORETIC) 20-25 MG tablet TAKE 1 TABLET EVERY DAY    Multiple Vitamins-Minerals (MULTIVITAMIN WITH MINERALS) tablet Take 1 tablet by mouth daily.    Omega-3 Fatty Acids (FISH OIL PO) Take 2 capsules by mouth daily.     Pseudoephedrine-Ibuprofen 30-200 MG TABS Take 2 tablets by mouth as needed. 12/07/2020: Last dose 9pm last night   Acetaminophen (TYLENOL ARTHRITIS PAIN PO) Take by mouth. (Patient not taking: Reported on 12/07/2020)    aspirin 81 MG tablet Take 81 mg by mouth 3 (three) times a week. (Patient not taking: Reported on 12/07/2020)    cetirizine (ZYRTEC) 10 MG tablet Take 10 mg by mouth every other day. (Patient not taking: Reported on 12/07/2020) 12/07/2020: Uses it 3x/week, none in the last 2 days   mupirocin cream (BACTROBAN) 2 % Apply 1 application topically 2 (two) times daily. (Patient not taking: No sig reported)    No facility-administered encounter medications on file as of 12/07/2020.   No Known Allergies  ROS as reported above.  Observations/Objective:  BP 129/69   Temp 99.4 F (37.4 C) (Oral)   Ht 5' 2.75" (1.594 m)   Wt 215 lb (97.5 kg)   LMP  (LMP Unknown)   BMI 38.39 kg/m   Patient is alert, oriented, and speaking normally. No sniffling, throat-clearing or coughing during visit. Exam is limited due to virtual nature of the visit.  Rapid covid test negative  Assessment and Plan:  Sinus pain - supportive measures reviewed. To avoid decongestants (unless closely monitoring BP). f/u if sx persist/worsen  Low grade fever  Patient was advised to: Use Tylenol if needed for headache, pain or fever Sinus rinses once or twice day (Neti-pot or Sinus Rinse Kit), if helpful Consider guaifenesin--mucinex/robitussin Drink lots of water Avoid decongestants which can raise blood pressure Instead, use Coricidin HBP, if helpful   COVID booster #2 suggested at some point in the future (can delay 1-3 mos if has COVID  now).   Follow Up Instructions:    I discussed the assessment and treatment plan with the patient. The patient was provided an opportunity to ask questions and all were answered. The patient agreed with the plan and demonstrated an understanding of the instructions.   The patient was advised to call back or seek an in-person evaluation if the symptoms worsen or if the condition fails to improve as anticipated.  I spent 25 minutes dedicated to the care of this patient, including pre-visit review of records, face to face time, post-visit ordering of testing and documentation.    Vikki Ports, MD

## 2020-12-08 LAB — SARS-COV-2, NAA 2 DAY TAT

## 2020-12-08 LAB — NOVEL CORONAVIRUS, NAA: SARS-CoV-2, NAA: NOT DETECTED

## 2020-12-26 DIAGNOSIS — Z1231 Encounter for screening mammogram for malignant neoplasm of breast: Secondary | ICD-10-CM | POA: Diagnosis not present

## 2020-12-26 LAB — HM MAMMOGRAPHY

## 2021-02-07 ENCOUNTER — Other Ambulatory Visit: Payer: Self-pay

## 2021-02-07 ENCOUNTER — Ambulatory Visit (INDEPENDENT_AMBULATORY_CARE_PROVIDER_SITE_OTHER): Payer: Medicare PPO | Admitting: Family Medicine

## 2021-02-07 VITALS — BP 130/78 | HR 71 | Temp 96.3°F | Ht 62.5 in | Wt 207.2 lb

## 2021-02-07 DIAGNOSIS — J302 Other seasonal allergic rhinitis: Secondary | ICD-10-CM | POA: Diagnosis not present

## 2021-02-07 DIAGNOSIS — H50112 Monocular exotropia, left eye: Secondary | ICD-10-CM

## 2021-02-07 DIAGNOSIS — E785 Hyperlipidemia, unspecified: Secondary | ICD-10-CM | POA: Diagnosis not present

## 2021-02-07 DIAGNOSIS — I10 Essential (primary) hypertension: Secondary | ICD-10-CM

## 2021-02-07 DIAGNOSIS — H5010 Unspecified exotropia: Secondary | ICD-10-CM | POA: Diagnosis not present

## 2021-02-07 DIAGNOSIS — E669 Obesity, unspecified: Secondary | ICD-10-CM | POA: Diagnosis not present

## 2021-02-07 DIAGNOSIS — R7301 Impaired fasting glucose: Secondary | ICD-10-CM

## 2021-02-07 DIAGNOSIS — Z0000 Encounter for general adult medical examination without abnormal findings: Secondary | ICD-10-CM

## 2021-02-07 LAB — LIPID PANEL
Chol/HDL Ratio: 5.3 ratio — ABNORMAL HIGH (ref 0.0–4.4)
Cholesterol, Total: 219 mg/dL — ABNORMAL HIGH (ref 100–199)
HDL: 41 mg/dL (ref >39)
LDL Chol Calc (NIH): 160 mg/dL — ABNORMAL HIGH (ref 0–99)
Triglycerides: 99 mg/dL (ref 0–149)
VLDL Cholesterol Cal: 18 mg/dL (ref 5–40)

## 2021-02-07 LAB — COMPREHENSIVE METABOLIC PANEL
ALT: 17 IU/L (ref 0–32)
AST: 16 IU/L (ref 0–40)
Albumin/Globulin Ratio: 1.4 (ref 1.2–2.2)
Albumin: 4.2 g/dL (ref 3.7–4.7)
Alkaline Phosphatase: 91 IU/L (ref 44–121)
BUN/Creatinine Ratio: 24 (ref 12–28)
BUN: 23 mg/dL (ref 8–27)
Bilirubin Total: 0.5 mg/dL (ref 0.0–1.2)
CO2: 25 mmol/L (ref 20–29)
Calcium: 9.5 mg/dL (ref 8.7–10.3)
Chloride: 103 mmol/L (ref 96–106)
Creatinine, Ser: 0.96 mg/dL (ref 0.57–1.00)
Globulin, Total: 3.1 g/dL (ref 1.5–4.5)
Glucose: 113 mg/dL — ABNORMAL HIGH (ref 65–99)
Potassium: 4 mmol/L (ref 3.5–5.2)
Sodium: 140 mmol/L (ref 134–144)
Total Protein: 7.3 g/dL (ref 6.0–8.5)
eGFR: 63 mL/min/{1.73_m2} (ref >59)

## 2021-02-07 LAB — CBC WITH DIFFERENTIAL/PLATELET
Basophils Absolute: 0 10*3/uL (ref 0.0–0.2)
Basos: 0 %
EOS (ABSOLUTE): 0.1 10*3/uL (ref 0.0–0.4)
Eos: 3 %
Hematocrit: 38.4 % (ref 34.0–46.6)
Hemoglobin: 12.4 g/dL (ref 11.1–15.9)
Immature Grans (Abs): 0 10*3/uL (ref 0.0–0.1)
Immature Granulocytes: 0 %
Lymphocytes Absolute: 1.4 10*3/uL (ref 0.7–3.1)
Lymphs: 30 %
MCH: 28.7 pg (ref 26.6–33.0)
MCHC: 32.3 g/dL (ref 31.5–35.7)
MCV: 89 fL (ref 79–97)
Monocytes Absolute: 0.4 10*3/uL (ref 0.1–0.9)
Monocytes: 10 %
Neutrophils Absolute: 2.6 10*3/uL (ref 1.4–7.0)
Neutrophils: 57 %
Platelets: 196 10*3/uL (ref 150–450)
RBC: 4.32 x10E6/uL (ref 3.77–5.28)
RDW: 13.7 % (ref 11.7–15.4)
WBC: 4.6 10*3/uL (ref 3.4–10.8)

## 2021-02-07 MED ORDER — LISINOPRIL-HYDROCHLOROTHIAZIDE 20-25 MG PO TABS: 1.0000 | ORAL_TABLET | Freq: Every day | ORAL | 3 refills | Status: DC

## 2021-02-07 NOTE — Progress Notes (Addendum)
Angel Ortega is a 70 y.o. female who presents for annual wellness visit,CPE and follow-up on chronic medical conditions.  She has no particular concerns or complaints.  She has started a walking program and has lost a few pounds.  She feels good about this.  Her allergies seem to be under good control..  She does use Zyrtec on an as-needed basis.  She continues on lisinopril/HCTZ and having no trouble with that.  She does use multivitamins.  Immunizations and Health Maintenance Immunization History  Administered Date(s) Administered   Fluad Quad(high Dose 65+) 02/04/2019, 03/06/2020   Influenza, High Dose Seasonal PF 05/11/2014, 03/09/2015, 05/28/2016, 05/01/2018   Influenza,inj,Quad PF,6+ Mos 04/07/2017   PFIZER(Purple Top)SARS-COV-2 Vaccination 07/22/2019, 08/12/2019, 04/03/2020   Pneumococcal Conjugate-13 12/13/2013   Pneumococcal Polysaccharide-23 12/01/2008   Tdap 12/01/2008, 02/18/2020   Zoster Recombinat (Shingrix) 01/18/2020, 06/27/2020   Zoster, Live 12/01/2008   Health Maintenance Due  Topic Date Due   COVID-19 Vaccine (4 - Booster for Pfizer series) 08/04/2020   INFLUENZA VACCINE  01/15/2021    Last Pap smear: aged out  Last mammogram:12/26/20 Last colonoscopy: 08/29/2005 cologuard 02/14/20 Last DEXA: 05/31/20 Dentist: Q Six months  Ophtho: Q year Exercise: walking QD  Other doctors caring for patient include: Dr. Pearline Cables derma.  Advanced directives: Does Patient Have a Medical Advance Directive?: Yes Type of Advance Directive: Living will Does patient want to make changes to medical advance directive?: No - Patient declined  Depression screen:  See questionnaire below.  Depression screen [facility] 2/9 02/07/2021 02/07/2020 02/04/2019 12/09/2017 01/13/2017  Decreased Interest 0 0 0 0 0  Down, Depressed, Hopeless 0 0 0 0 0  PHQ - 2 Score 0 0 0 0 0    Fall Risk Screen: see questionnaire below. Fall Risk  02/07/2021 02/07/2020 02/04/2019 12/09/2017 01/13/2017  Falls in the past year?  0 0 0 No No  Number falls in past yr: 0 0 - - -  Injury with Fall? 0 0 - - -  Risk for fall due to : No Fall Risks - - - -  Follow up Falls evaluation completed - - - -    ADL screen:  See questionnaire below Functional Status Survey: Is the patient deaf or have difficulty hearing?: No Does the patient have difficulty seeing, even when wearing glasses/contacts?: No Does the patient have difficulty concentrating, remembering, or making decisions?: No Does the patient have difficulty walking or climbing stairs?: No Does the patient have difficulty dressing or bathing?: No Does the patient have difficulty doing errands alone such as visiting a doctor's office or shopping?: No   Review of Systems Constitutional: -, -unexpected weight change, -anorexia, -fatigue Allergy: -sneezing, -itching, -congestion Dermatology: denies changing moles, rash, lumps ENT: -runny nose, -ear pain, -sore throat,  Cardiology:  -chest pain, -palpitations, -orthopnea, Respiratory: -cough, -shortness of breath, -dyspnea on exertion, -wheezing,  Gastroenterology: -abdominal pain, -nausea, -vomiting, -diarrhea, -constipation, -dysphagia Hematology: -bleeding or bruising problems Musculoskeletal: -arthralgias, -myalgias, -joint swelling, -back pain, - Ophthalmology: -vision changes,  Urology: -dysuria, -difficulty urinating,  -urinary frequency, -urgency, incontinence Neurology: -, -numbness, , -memory loss, -falls, -dizziness    PHYSICAL EXAM:  LMP  (LMP Unknown)   General Appearance: Alert, cooperative, no distress, appears stated age Head: Normocephalic, without obvious abnormality, atraumatic Eyes: PERRL, conjunctiva/corneas clear, EOM's intact, esotropia noted of left eye Ears: Normal TM's and external ear canals Nose: Nares normal, mucosa normal, no drainage or sinus tenderness Throat: Lips, mucosa, and tongue normal; teeth and gums normal Neck: Supple,  no lymphadenopathy;  thyroid:  no  enlargement/tenderness/nodules; no carotid bruit or JVD Lungs: Clear to auscultation bilaterally without wheezes, rales or ronchi; respirations unlabored Heart: Regular rate and rhythm, S1 and S2 normal, no murmur, rubor gallop Abdomen: Soft, non-tender, nondistended, normoactive bowel sounds,  no masses, no hepatosplenomegaly Skin:  Skin color, texture, turgor normal, no rashes or lesions Lymph nodes: Cervical, supraclavicular, and axillary nodes normal Neurologic:  CNII-XII intact, normal strength, sensation and gait; reflexes 2+ and symmetric throughout Psych: Normal mood, affect, hygiene and grooming.  ASSESSMENT/PLAN: Essential hypertension - Plan: CBC with Differential/Platelet, Comprehensive metabolic panel, lisinopril-hydrochlorothiazide (ZESTORETIC) 20-25 MG tablet  Seasonal allergic rhinitis, unspecified trigger  Exotropia of left eye  Obesity (BMI 30-39.9)  Hyperlipidemia LDL goal <130 - Plan: Lipid panel  Impaired fasting glucose Encouraged her to continue with her diet and exercise program.  She has made changes reducing carbohydrates.  Continue on present medication.  Did state that it was okay to stop her aspirin.   Immunization recommendations discussed.  Colonoscopy recommendations reviewed   Medicare Attestation I have personally reviewed: The patient's medical and social history Their use of alcohol, tobacco or illicit drugs Their current medications and supplements The patient's functional ability including ADLs,fall risks, home safety risks, cognitive, and hearing and visual impairment Diet and physical activities Evidence for depression or mood disorders  The patient's weight, height, and BMI have been recorded in the chart.  I have made referrals, counseling, and provided education to the patient based on review of the above and I have provided the patient with a written personalized care plan for preventive services.     Jill Alexanders, MD   02/07/2021

## 2021-02-07 NOTE — Patient Instructions (Signed)
  [name] , Thank you for taking time to come for your Medicare Wellness Visit. I appreciate your ongoing commitment to your health goals. Please review the following plan we discussed and let me know if I can assist you in the future.   These are the goals we discussed:  Goals   None     This is a list of the screening recommended for you and due dates:  Health Maintenance  Topic Date Due   COVID-19 Vaccine (4 - Booster for Pfizer series) 08/04/2020   Flu Shot  01/15/2021   Mammogram  12/27/2022   Cologuard (Stool DNA test)  02/14/2023   Tetanus Vaccine  02/17/2030   DEXA scan (bone density measurement)  Completed   Hepatitis C Screening: USPSTF Recommendation to screen - Ages 34-79 yo.  Completed   Zoster (Shingles) Vaccine  Completed   HPV Vaccine  Aged Out   Pap Smear  Discontinued   Pneumonia vaccines  Discontinued

## 2021-02-22 ENCOUNTER — Other Ambulatory Visit: Payer: Medicare PPO

## 2021-04-09 ENCOUNTER — Other Ambulatory Visit: Payer: Self-pay | Admitting: Family Medicine

## 2021-04-09 ENCOUNTER — Other Ambulatory Visit: Payer: Self-pay

## 2021-04-09 ENCOUNTER — Other Ambulatory Visit (INDEPENDENT_AMBULATORY_CARE_PROVIDER_SITE_OTHER): Payer: Medicare PPO

## 2021-04-09 DIAGNOSIS — Z23 Encounter for immunization: Secondary | ICD-10-CM | POA: Diagnosis not present

## 2021-04-09 DIAGNOSIS — M1990 Unspecified osteoarthritis, unspecified site: Secondary | ICD-10-CM

## 2021-04-09 NOTE — Telephone Encounter (Signed)
[facility] to [facility]. Please advise [facility]

## 2021-07-09 ENCOUNTER — Telehealth: Payer: Self-pay

## 2021-07-09 NOTE — Telephone Encounter (Signed)
Pt states she would like to see a foot specialist for her right foot, thinks has heel spur and would like referral to [facility] will be fine unless you have another you prefer.

## 2021-07-10 NOTE — Telephone Encounter (Signed)
Marguarite Arbour made appt

## 2021-07-16 ENCOUNTER — Other Ambulatory Visit: Payer: Self-pay

## 2021-07-16 ENCOUNTER — Ambulatory Visit: Payer: Medicare PPO | Admitting: Family Medicine

## 2021-07-16 ENCOUNTER — Encounter: Payer: Self-pay | Admitting: Family Medicine

## 2021-07-16 VITALS — BP 162/80 | HR 76 | Temp 97.4°F | Wt 211.6 lb

## 2021-07-16 DIAGNOSIS — M722 Plantar fascial fibromatosis: Secondary | ICD-10-CM | POA: Diagnosis not present

## 2021-07-16 DIAGNOSIS — I10 Essential (primary) hypertension: Secondary | ICD-10-CM | POA: Diagnosis not present

## 2021-07-16 NOTE — Progress Notes (Signed)
° °  Subjective:    Patient ID: Angel Ortega, female    DOB: 06/25/1950, 71 y.o.   MRN: [id number]  HPI She complains of a 41-month history of right heel pain.  Is worse in the morning when she wakes up or if she sits for long period of time then gets up and moves again.  No history of overuse or injury.   Review of Systems     Objective:   Physical Exam Exam of the right foot shows normal motion of the foot.  No tenderness over the Achilles tendon.  Slight tenderness to the medial aspect of the calcaneus.  Minimal tenderness over the calcaneal spur.       Assessment & Plan:  Plantar fasciitis of right foot  Essential hypertension, benign Information given concerning plantar fasciitis although some of her symptoms are more of apophysitis.  Recommend stretching, arch support, anti-inflammatory of choice for the next month and if still having difficulty, may possibly refer for an injection.  She will call me if no improvement. She is also to come back in a month to recheck her blood pressure.

## 2021-07-16 NOTE — Patient Instructions (Signed)
Plantar Fasciitis Plantar fasciitis is a painful foot condition that affects the heel. It occurs when the band of tissue that connects the toes to the heel bone (plantar fascia) becomes irritated. This can happen as the result of exercising too much or doing other repetitive activities (overuse injury). Plantar fasciitis can cause mild irritation to severe pain that makes it difficult to walk or move. The pain is usually worse in the morning after sleeping, or after sitting or lying down for a period of time. Pain may also be worse after long periods of walking or standing. What are the causes? This condition may be caused by: Standing for long periods of time. Wearing shoes that do not have good arch support. Doing activities that put stress on joints (high-impact activities). This includes ballet and exercise that makes your heart beat faster (aerobic exercise), such as running. Being overweight. An abnormal way of walking (gait). Tight muscles in the back of your lower leg (calf). High arches in your feet or flat feet. Starting a new athletic activity. What are the signs or symptoms? The main symptom of this condition is heel pain. Pain may get worse after the following: Taking the first steps after a time of rest, especially in the morning after awakening, or after you have been sitting or lying down for a while. Long periods of standing still. Pain may decrease after 30-45 minutes of activity, such as gentle walking. How is this diagnosed? This condition may be diagnosed based on your medical history, a physical exam, and your symptoms. Your health care provider will check for: A tender area on the bottom of your foot. A high arch in your foot or flat feet. Pain when you move your foot. Difficulty moving your foot. You may have imaging tests to confirm the diagnosis, such as: X-rays. Ultrasound. MRI. How is this treated? Treatment for plantar fasciitis depends on how severe your  condition is. Treatment may include: Rest, ice, pressure (compression), and raising (elevating) the affected foot. This is called RICE therapy. Your health care provider may recommend RICE therapy along with over-the-counter pain medicines to manage your pain. Exercises to stretch your calves and your plantar fascia. A splint that holds your foot in a stretched, upward position while you sleep (night splint). Physical therapy to relieve symptoms and prevent problems in the future. Injections of steroid medicine (cortisone) to relieve pain and inflammation. Stimulating your plantar fascia with electrical impulses (extracorporeal shock wave therapy). This is usually the last treatment option before surgery. Surgery, if other treatments have not worked after 12 months. Follow these instructions at home: Managing pain, stiffness, and swelling  If directed, put ice on the painful area. To do this: Put ice in a plastic bag, or use a frozen bottle of water. Place a towel between your skin and the bag or bottle. Roll the bottom of your foot over the bag or bottle. Do this for 20 minutes, 2-3 times a day. Wear athletic shoes that have air-sole or gel-sole cushions, or try soft shoe inserts that are designed for plantar fasciitis. Elevate your foot above the level of your heart while you are sitting or lying down. Activity Avoid activities that cause pain. Ask your health care provider what activities are safe for you. Do physical therapy exercises and stretches as told by your health care provider. Try activities and forms of exercise that are easier on your joints (low impact). Examples include swimming, water aerobics, and biking. General instructions Take over-the-counter and  prescription medicines only as told by your health care provider. Wear a night splint while sleeping, if told by your health care provider. Loosen the splint if your toes tingle, become numb, or turn cold and blue. Maintain a  healthy weight, or work with your health care provider to lose weight as needed. Keep all follow-up visits. This is important. Contact a health care provider if you have: Symptoms that do not go away with home treatment. Pain that gets worse. Pain that affects your ability to move or do daily activities. Summary Plantar fasciitis is a painful foot condition that affects the heel. It occurs when the band of tissue that connects the toes to the heel bone (plantar fascia) becomes irritated. Heel pain is the main symptom of this condition. It may get worse after exercising too much or standing still for a long time. Treatment varies, but it usually starts with rest, ice, pressure (compression), and raising (elevating) the affected foot. This is called RICE therapy. Over-the-counter medicines can also be used to manage pain. This information is not intended to replace advice given to you by your health care provider.  Take ibuprofen 3 times per day.Use arch supports that are made out of cork or something rigid Do some stretching of your heel and Achilles tendon.  You can do heat to that area for 20 minutes 3 times per day if you want do this for at least 1 month and if you get better keep doing it.  If no success, call me    Document Revised: 09/20/2019 Document Reviewed: 09/20/2019 Elsevier Patient Education  [place].

## 2021-07-27 ENCOUNTER — Other Ambulatory Visit: Payer: Medicare PPO

## 2021-08-27 ENCOUNTER — Other Ambulatory Visit: Payer: Self-pay

## 2021-08-27 ENCOUNTER — Encounter: Payer: Self-pay | Admitting: Family Medicine

## 2021-08-27 ENCOUNTER — Ambulatory Visit: Payer: Medicare PPO | Admitting: Family Medicine

## 2021-08-27 VITALS — BP 180/92 | HR 78 | Temp 97.3°F | Wt 212.6 lb

## 2021-08-27 DIAGNOSIS — I10 Essential (primary) hypertension: Secondary | ICD-10-CM

## 2021-08-27 DIAGNOSIS — E785 Hyperlipidemia, unspecified: Secondary | ICD-10-CM | POA: Diagnosis not present

## 2021-08-27 DIAGNOSIS — M722 Plantar fascial fibromatosis: Secondary | ICD-10-CM

## 2021-08-27 NOTE — Patient Instructions (Signed)
Check out the Dash.  Start back exercising again monitor your blood pressure just once a week make sure you take it in the resting sitting position with the arm at heart level.  Send a message on MyChart what your blood pressures have been running over the last several months.  I am shooting for your blood pressure to be 140/90 or lower . ?

## 2021-08-27 NOTE — Progress Notes (Signed)
? ?  Subjective:  ? ? Patient ID: Angel Ortega, female    DOB: September 12, 1950, 71 y.o.   MRN: [id number] ? ?HPI ?She is here for recheck.  She has had difficulty with plantar fasciitis which has interfered with her ability to exercise.  She has been checking her blood pressure at home.  She continues on lisinopril/HCTZ and is having no difficulty with that.  She also has a history of hyperlipidemia but presently is on no medications. ? ? ?Review of Systems ? ?   ?Objective:  ? Physical Exam ?Alert and in no distress.  Blood pressure is recorded. ? ? ? ?   ?Assessment & Plan:  ?Essential hypertension, benign ? ?Plantar fasciitis of right foot ? ?Hyperlipidemia LDL goal <130 ?Marland KitchenCheck out the Dash.  Start back exercising again monitor your blood pressure just once a week make sure you take it in the resting sitting position with the arm at heart level.  Send a message on MyChart what your blood pressures have been running over the last several months.  I am shooting for your blood pressure to be 140/90 or lower . ? ?

## 2021-12-03 ENCOUNTER — Other Ambulatory Visit: Payer: Self-pay | Admitting: Family Medicine

## 2021-12-03 DIAGNOSIS — M1990 Unspecified osteoarthritis, unspecified site: Secondary | ICD-10-CM

## 2021-12-03 NOTE — Telephone Encounter (Signed)
[facility] . Please advise Kh

## 2022-01-01 DIAGNOSIS — Z1231 Encounter for screening mammogram for malignant neoplasm of breast: Secondary | ICD-10-CM | POA: Diagnosis not present

## 2022-01-02 LAB — HM MAMMOGRAPHY

## 2022-02-08 ENCOUNTER — Other Ambulatory Visit: Payer: Self-pay | Admitting: Family Medicine

## 2022-02-08 ENCOUNTER — Ambulatory Visit (INDEPENDENT_AMBULATORY_CARE_PROVIDER_SITE_OTHER): Payer: Medicare PPO

## 2022-02-08 VITALS — Ht 63.5 in | Wt 213.0 lb

## 2022-02-08 DIAGNOSIS — Z0000 Encounter for general adult medical examination without abnormal findings: Secondary | ICD-10-CM

## 2022-02-08 DIAGNOSIS — I10 Essential (primary) hypertension: Secondary | ICD-10-CM

## 2022-02-08 NOTE — Patient Instructions (Signed)
Angel Ortega , Thank you for taking time to come for your Medicare Wellness Visit. I appreciate your ongoing commitment to your health goals. Please review the following plan we discussed and let me know if I can assist you in the future.   Screening recommendations/referrals: Colonoscopy: cologuard 02/14/2020, due 02/14/2023 Mammogram: completed 01/02/2022, due 01/04/2023 Bone Density: completed 05/31/2020 Recommended yearly ophthalmology/optometry visit for glaucoma screening and checkup Recommended yearly dental visit for hygiene and checkup  Vaccinations: Influenza vaccine: due Pneumococcal vaccine: completed 12/13/2013 Tdap vaccine: completed 02/18/2020, due 02/17/2030 Shingles vaccine: completed   Covid-19: 04/27/2021, 04/03/2020, 08/12/2019, 07/22/2019  Advanced directives: copy in chart  Conditions/risks identified: none  Next appointment: Follow up in one year for your annual wellness visit    Preventive Care 16 Years and Older, Female Preventive care refers to lifestyle choices and visits with your health care provider that can promote health and wellness. What does preventive care include? A yearly physical exam. This is also called an annual well check. Dental exams once or twice a year. Routine eye exams. Ask your health care provider how often you should have your eyes checked. Personal lifestyle choices, including: Daily care of your teeth and gums. Regular physical activity. Eating a healthy diet. Avoiding tobacco and drug use. Limiting alcohol use. Practicing safe sex. Taking low-dose aspirin every day. Taking vitamin and mineral supplements as recommended by your health care provider. What happens during an annual well check? The services and screenings done by your health care provider during your annual well check will depend on your age, overall health, lifestyle risk factors, and family history of disease. Counseling  Your health care provider may ask you questions  about your: Alcohol use. Tobacco use. Drug use. Emotional well-being. Home and relationship well-being. Sexual activity. Eating habits. History of falls. Memory and ability to understand (cognition). Work and work Statistician. Reproductive health. Screening  You may have the following tests or measurements: Height, weight, and BMI. Blood pressure. Lipid and cholesterol levels. These may be checked every 5 years, or more frequently if you are over 110 years old. Skin check. Lung cancer screening. You may have this screening every year starting at age 53 if you have a 30-pack-year history of smoking and currently smoke or have quit within the past 15 years. Fecal occult blood test (FOBT) of the stool. You may have this test every year starting at age 12. Flexible sigmoidoscopy or colonoscopy. You may have a sigmoidoscopy every 5 years or a colonoscopy every 10 years starting at age 60. Hepatitis C blood test. Hepatitis B blood test. Sexually transmitted disease (STD) testing. Diabetes screening. This is done by checking your blood sugar (glucose) after you have not eaten for a while (fasting). You may have this done every 1-3 years. Bone density scan. This is done to screen for osteoporosis. You may have this done starting at age 4. Mammogram. This may be done every 1-2 years. Talk to your health care provider about how often you should have regular mammograms. Talk with your health care provider about your test results, treatment options, and if necessary, the need for more tests. Vaccines  Your health care provider may recommend certain vaccines, such as: Influenza vaccine. This is recommended every year. Tetanus, diphtheria, and acellular pertussis (Tdap, Td) vaccine. You may need a Td booster every 10 years. Zoster vaccine. You may need this after age 51. Pneumococcal 13-valent conjugate (PCV13) vaccine. One dose is recommended after age 80. Pneumococcal polysaccharide (PPSV23)  vaccine. One  dose is recommended after age 38. Talk to your health care provider about which screenings and vaccines you need and how often you need them. This information is not intended to replace advice given to you by your health care provider. Make sure you discuss any questions you have with your health care provider. Document Released: 06/30/2015 Document Revised: 02/21/2016 Document Reviewed: 04/04/2015 Elsevier Interactive Patient Education  2017 [employer] Prevention in the Home Falls can cause injuries. They can happen to people of all ages. There are many things you can do to make your home safe and to help prevent falls. What can I do on the outside of my home? Regularly fix the edges of walkways and driveways and fix any cracks. Remove anything that might make you trip as you walk through a door, such as a raised step or threshold. Trim any bushes or trees on the path to your home. Use bright outdoor lighting. Clear any walking paths of anything that might make someone trip, such as rocks or tools. Regularly check to see if handrails are loose or broken. Make sure that both sides of any steps have handrails. Any raised decks and porches should have guardrails on the edges. Have any leaves, snow, or ice cleared regularly. Use sand or salt on walking paths during winter. Clean up any spills in your garage right away. This includes oil or grease spills. What can I do in the bathroom? Use night lights. Install grab bars by the toilet and in the tub and shower. Do not use towel bars as grab bars. Use non-skid mats or decals in the tub or shower. If you need to sit down in the shower, use a plastic, non-slip stool. Keep the floor dry. Clean up any water that spills on the floor as soon as it happens. Remove soap buildup in the tub or shower regularly. Attach bath mats securely with double-sided non-slip rug tape. Do not have throw rugs and other things on the floor that  can make you trip. What can I do in the bedroom? Use night lights. Make sure that you have a light by your bed that is easy to reach. Do not use any sheets or blankets that are too big for your bed. They should not hang down onto the floor. Have a firm chair that has side arms. You can use this for support while you get dressed. Do not have throw rugs and other things on the floor that can make you trip. What can I do in the kitchen? Clean up any spills right away. Avoid walking on wet floors. Keep items that you use a lot in easy-to-reach places. If you need to reach something above you, use a strong step stool that has a grab bar. Keep electrical cords out of the way. Do not use floor polish or wax that makes floors slippery. If you must use wax, use non-skid floor wax. Do not have throw rugs and other things on the floor that can make you trip. What can I do with my stairs? Do not leave any items on the stairs. Make sure that there are handrails on both sides of the stairs and use them. Fix handrails that are broken or loose. Make sure that handrails are as long as the stairways. Check any carpeting to make sure that it is firmly attached to the stairs. Fix any carpet that is loose or worn. Avoid having throw rugs at the top or bottom of the stairs.  If you do have throw rugs, attach them to the floor with carpet tape. Make sure that you have a light switch at the top of the stairs and the bottom of the stairs. If you do not have them, ask someone to add them for you. What else can I do to help prevent falls? Wear shoes that: Do not have high heels. Have rubber bottoms. Are comfortable and fit you well. Are closed at the toe. Do not wear sandals. If you use a stepladder: Make sure that it is fully opened. Do not climb a closed stepladder. Make sure that both sides of the stepladder are locked into place. Ask someone to hold it for you, if possible. Clearly mark and make sure that you  can see: Any grab bars or handrails. First and last steps. Where the edge of each step is. Use tools that help you move around (mobility aids) if they are needed. These include: Canes. Walkers. Scooters. Crutches. Turn on the lights when you go into a dark area. Replace any light bulbs as soon as they burn out. Set up your furniture so you have a clear path. Avoid moving your furniture around. If any of your floors are uneven, fix them. If there are any pets around you, be aware of where they are. Review your medicines with your doctor. Some medicines can make you feel dizzy. This can increase your chance of falling. Ask your doctor what other things that you can do to help prevent falls. This information is not intended to replace advice given to you by your health care provider. Make sure you discuss any questions you have with your health care provider. Document Released: 03/30/2009 Document Revised: 11/09/2015 Document Reviewed: 07/08/2014 Elsevier Interactive Patient Education  2017 [employer].

## 2022-02-08 NOTE — Progress Notes (Signed)
I connected with Angel Ortega today by telephone and verified that I am speaking with the correct person using two identifiers. Location patient: home Location provider: work Persons participating in the virtual visit: Angel Ortega, [name].   I discussed the limitations, risks, security and privacy concerns of performing an evaluation and management service by telephone and the availability of in person appointments. I also discussed with the patient that there may be a patient responsible charge related to this service. The patient expressed understanding and verbally consented to this telephonic visit.    Interactive audio and video telecommunications were attempted between this provider and patient, however failed, due to patient having technical difficulties OR patient did not have access to video capability.  We continued and completed visit with audio only.     Vital signs may be patient reported or missing.  Subjective:   Angel Ortega is a 71 y.o. female who presents for Medicare Annual (Subsequent) preventive examination.  Review of Systems     Cardiac Risk Factors include: advanced age (>42mn, >>73women);dyslipidemia;hypertension;obesity (BMI >30kg/m2)     Objective:    Today's Vitals   02/08/22 0857  Weight: 213 lb (96.6 kg)  Height: 5' 3.5" (1.613 m)   Body mass index is 37.14 kg/m.     02/08/2022    9:02 AM 02/07/2021    9:47 AM 02/07/2020   12:56 PM 02/04/2019    9:04 AM 12/09/2017   10:04 AM 01/13/2017   11:04 AM 11/27/2015   11:16 AM  Advanced Directives  Does Patient Have a Medical Advance Directive? Yes Yes No No No No Yes  Type of [place] Living will     [place]  Does patient want to make changes to medical advance directive?  No - Patient declined       [place]? Yes - validated most recent copy scanned in chart (See row  information)      No - copy requested  Would patient like information on creating a medical advance directive?   Yes (MAU/Ambulatory/Procedural Areas - Information given) Yes (MAU/Ambulatory/Procedural Areas - Information given) Yes (MAU/Ambulatory/Procedural Areas - Information given) Yes (MAU/Ambulatory/Procedural Areas - Information given)     Current Medications (verified) Outpatient Encounter Medications as of 02/08/2022  Medication Sig   Acetaminophen (TYLENOL ARTHRITIS PAIN PO) Take by mouth.   cetirizine (ZYRTEC) 10 MG tablet Take 10 mg by mouth every other day.   CLINPRO 5000 1.1 % PSTE    ibuprofen (ADVIL) 800 MG tablet TAKE 1 TABLET BY MOUTH EVERY 8 HOURS AS NEEDED FOR MODERATE PAIN.   lisinopril-hydrochlorothiazide (ZESTORETIC) 20-25 MG tablet Take 1 tablet by mouth daily.   Multiple Vitamins-Minerals (MULTIVITAMIN WITH MINERALS) tablet Take 1 tablet by mouth daily.   Omega-3 Fatty Acids (FISH OIL PO) Take 2 capsules by mouth daily.    aspirin 81 MG tablet Take 81 mg by mouth 3 (three) times a week. (Patient not taking: Reported on 08/27/2021)   mupirocin cream (BACTROBAN) 2 % Apply 1 application topically 2 (two) times daily. (Patient not taking: Reported on 02/07/2020)   Pseudoephedrine-Ibuprofen 30-200 MG TABS Take 2 tablets by mouth as needed. (Patient not taking: Reported on 02/07/2021)   No facility-administered encounter medications on file as of 02/08/2022.    Allergies (verified) Patient has no known allergies.   History: Past Medical History:  Diagnosis Date   Fibroid    Heel  spur    Hyperlipidemia    Staph infection 2012   Past Surgical History:  Procedure Laterality Date   ABDOMINAL HYSTERECTOMY  1996   TAH/FIBROIDS   EYE MUSCLE SURGERY  child   L eye   HERNIA REPAIR     MYOMECTOMY ABDOMINAL APPROACH  1986   Staph infection  2012   Right thigh   Family History  Problem Relation Age of Onset   Cancer Mother        lung cancer   Diabetes Father     Hypertension Father    Diabetes Sister    Cancer Brother        ?tumors in muscles   Hyperlipidemia Sister    Hypertension Sister    Hyperlipidemia Sister    Hypertension Sister    Social History   Socioeconomic History   Marital status: Married    Spouse name: Not on file   Number of children: Not on file   Years of education: Not on file   Highest education level: Not on file  Occupational History   Not on file  Tobacco Use   Smoking status: Never   Smokeless tobacco: Never  Vaping Use   Vaping Use: Never used  Substance and Sexual Activity   Alcohol use: No   Drug use: No   Sexual activity: Yes    Birth control/protection: Surgical    Comment: 71 YEARS OLD, NO MORE THAN 5 PARTNERS  Other Topics Concern   Not on file  Social History Narrative   Not on file   Social Determinants of Health   Financial Resource Strain: Low Risk  (02/08/2022)   Overall Financial Resource Strain (CARDIA)    Difficulty of Paying Living Expenses: Not hard at all  Food Insecurity: No Food Insecurity (02/08/2022)   Hunger Vital Sign    Worried About Running Out of Food in the Last Year: Never true    Ran Out of Food in the Last Year: Never true  Transportation Needs: No Transportation Needs (02/08/2022)   PRAPARE - Hydrologist (Medical): No    Lack of Transportation (Non-Medical): No  Physical Activity: Sufficiently Active (02/08/2022)   Exercise Vital Sign    Days of Exercise per Week: 5 days    Minutes of Exercise per Session: 30 min  Stress: No Stress Concern Present (02/08/2022)   [place]    Feeling of Stress : Not at all  Social Connections: Not on file    Tobacco Counseling Counseling given: Not Answered   Clinical Intake:  Pre-visit preparation completed: Yes  Pain : No/denies pain     Nutritional Status: BMI > 30  Obese Nutritional Risks: None Diabetes: No  How often  do you need to have someone help you when you read instructions, pamphlets, or other written materials from your doctor or pharmacy?: 1 - Never What is the last grade level you completed in school?: 12th grade  Diabetic? no  Interpreter Needed?: No  Information entered by :: NAllen LPN   Activities of Daily Living    02/08/2022    9:04 AM  In your present state of health, do you have any difficulty performing the following activities:  Hearing? 0  Vision? 0  Difficulty concentrating or making decisions? 0  Walking or climbing stairs? 0  Dressing or bathing? 0  Doing errands, shopping? 0  Preparing Food and eating ? N  Using the Toilet? N  In the past six months, have you accidently leaked urine? N  Do you have problems with loss of bowel control? N  Managing your Medications? N  Managing your Finances? N  Housekeeping or managing your Housekeeping? N    Patient Care Team: Denita Lung, MD as PCP - General (Family Medicine)  Indicate any recent Medical Services you may have received from other than [facility] providers in the past year (date may be approximate).     Assessment:   This is a routine wellness examination for Brayley.  Hearing/Vision screen Vision Screening - Comments:: Regular eye exams, Dr. Alroy Dust Nobel  Dietary issues and exercise activities discussed: Current Exercise Habits: Home exercise routine, Type of exercise: walking, Time (Minutes): 30, Frequency (Times/Week): 5, Weekly Exercise (Minutes/Week): 150   Goals Addressed             This Visit's Progress    Patient Stated       02/08/2022, wants to lose weight       Depression Screen    02/08/2022    9:03 AM 02/07/2021    9:48 AM 02/07/2020    9:30 AM 02/04/2019    8:27 AM 12/09/2017    9:39 AM 01/13/2017   10:21 AM 11/27/2015    9:58 AM  PHQ 2/9 Scores  PHQ - 2 Score 0 0 0 0 0 0 0  PHQ- 9 Score 0          Fall Risk    02/08/2022    9:03 AM 02/07/2021    9:48 AM 02/07/2020    9:30 AM  02/04/2019    8:26 AM 12/09/2017    9:39 AM  Fall Risk   Falls in the past year? 0 0 0 0 No  Number falls in past yr: 0 0 0    Injury with Fall? 0 0 0    Risk for fall due to : Medication side effect No Fall Risks     Follow up Falls evaluation completed;Education provided;Falls prevention discussed Falls evaluation completed       FALL RISK PREVENTION PERTAINING TO THE HOME:  Any stairs in or around the home? Yes  If so, are there any without handrails? Yes  Home free of loose throw rugs in walkways, pet beds, electrical cords, etc? Yes  Adequate lighting in your home to reduce risk of falls? Yes   ASSISTIVE DEVICES UTILIZED TO PREVENT FALLS:  Life alert? No  Use of a cane, walker or w/c? No  Grab bars in the bathroom? No  Shower chair or bench in shower? No  Elevated toilet seat or a handicapped toilet? No   TIMED UP AND GO:  Was the test performed? No .      Cognitive Function:        02/08/2022    9:05 AM  6CIT Screen  What Year? 0 points  What month? 0 points  What time? 0 points  Count back from 20 0 points  Months in reverse 2 points  Repeat phrase 0 points  Total Score 2 points    Immunizations Immunization History  Administered Date(s) Administered   Fluad Quad(high Dose 65+) 02/04/2019, 03/06/2020, 04/09/2021   Influenza, High Dose Seasonal PF 05/11/2014, 03/09/2015, 05/28/2016, 05/01/2018   Influenza,inj,Quad PF,6+ Mos 04/07/2017   PFIZER(Purple Top)SARS-COV-2 Vaccination 07/22/2019, 08/12/2019, 04/03/2020   Pfizer Covid-19 Vaccine Bivalent Booster 57yr & up 04/27/2021   Pneumococcal Conjugate-13 12/13/2013   Pneumococcal Polysaccharide-23 12/01/2008   Tdap 12/01/2008, 02/18/2020   Zoster Recombinat (  Shingrix) 01/18/2020, 06/27/2020   Zoster, Live 12/01/2008    TDAP status: Up to date  Flu Vaccine status: Due, Education has been provided regarding the importance of this vaccine. Advised may receive this vaccine at local pharmacy or Health  Dept. Aware to provide a copy of the vaccination record if obtained from local pharmacy or Health Dept. Verbalized acceptance and understanding.  Pneumococcal vaccine status: Up to date  Covid-19 vaccine status: Completed vaccines  Qualifies for Shingles Vaccine? Yes   Zostavax completed Yes   Shingrix Completed?: Yes  Screening Tests Health Maintenance  Topic Date Due   COVID-19 Vaccine (5 - Pfizer series) 08/25/2021   INFLUENZA VACCINE  01/15/2022   Fecal DNA (Cologuard)  02/14/2023   MAMMOGRAM  01/03/2024   TETANUS/TDAP  02/17/2030   DEXA SCAN  Completed   Hepatitis C Screening  Completed   Zoster Vaccines- Shingrix  Completed   HPV VACCINES  Aged Out   Pneumonia Vaccine 36+ Years old  Discontinued   PAP SMEAR-Modifier  Discontinued    Health Maintenance  Health Maintenance Due  Topic Date Due   COVID-19 Vaccine (5 - Pfizer series) 08/25/2021   INFLUENZA VACCINE  01/15/2022    Colorectal cancer screening: Type of screening: Cologuard. Completed 02/14/2020. Repeat every 3 years  Mammogram status: Completed 01/02/2022. Repeat every year  Bone Density status: Completed 05/31/2020.   Lung Cancer Screening: (Low Dose CT Chest recommended if Age 81-80 years, 30 pack-year currently smoking OR have quit w/in 15years.) does not qualify.   Lung Cancer Screening Referral: no  Additional Screening:  Hepatitis C Screening: does qualify; Completed 11/27/2015  Vision Screening: Recommended annual ophthalmology exams for early detection of glaucoma and other disorders of the eye. Is the patient up to date with their annual eye exam?  Yes  Who is the provider or what is the name of the office in which the patient attends annual eye exams? Dr. Fredderick [place] If pt is not established with a provider, would they like to be referred to a provider to establish care? No .   Dental Screening: Recommended annual dental exams for proper oral hygiene  Community Resource Referral / Chronic Care  Management: CRR required this visit?  No   CCM required this visit?  No      Plan:     I have personally reviewed and noted the following in the patient's chart:   Medical and social history Use of alcohol, tobacco or illicit drugs  Current medications and supplements including opioid prescriptions. Patient is not currently taking opioid prescriptions. Functional ability and status Nutritional status Physical activity Advanced directives List of other physicians Hospitalizations, surgeries, and ER visits in previous 12 months Vitals Screenings to include cognitive, depression, and falls Referrals and appointments  In addition, I have reviewed and discussed with patient certain preventive protocols, quality metrics, and best practice recommendations. A written personalized care plan for preventive services as well as general preventive health recommendations were provided to patient.     Kellie Simmering, LPN   [id number]   Nurse Notes: none  Due to this being a virtual visit, the after visit summary with patients personalized plan was offered to patient via mail or my-chart.  to pick up at office at next visit

## 2022-02-19 ENCOUNTER — Encounter: Payer: Self-pay | Admitting: Family Medicine

## 2022-02-19 ENCOUNTER — Ambulatory Visit: Payer: Medicare PPO | Admitting: Family Medicine

## 2022-02-19 VITALS — BP 138/72 | HR 73 | Temp 97.5°F | Ht 62.5 in | Wt 206.6 lb

## 2022-02-19 DIAGNOSIS — H50112 Monocular exotropia, left eye: Secondary | ICD-10-CM

## 2022-02-19 DIAGNOSIS — J302 Other seasonal allergic rhinitis: Secondary | ICD-10-CM

## 2022-02-19 DIAGNOSIS — Z0000 Encounter for general adult medical examination without abnormal findings: Secondary | ICD-10-CM | POA: Diagnosis not present

## 2022-02-19 DIAGNOSIS — R7301 Impaired fasting glucose: Secondary | ICD-10-CM | POA: Diagnosis not present

## 2022-02-19 DIAGNOSIS — E669 Obesity, unspecified: Secondary | ICD-10-CM | POA: Diagnosis not present

## 2022-02-19 DIAGNOSIS — I10 Essential (primary) hypertension: Secondary | ICD-10-CM

## 2022-02-19 DIAGNOSIS — E785 Hyperlipidemia, unspecified: Secondary | ICD-10-CM

## 2022-02-19 DIAGNOSIS — M25562 Pain in left knee: Secondary | ICD-10-CM

## 2022-02-19 MED ORDER — LISINOPRIL-HYDROCHLOROTHIAZIDE 20-25 MG PO TABS: 1.0000 | ORAL_TABLET | Freq: Every day | ORAL | 3 refills | Status: DC

## 2022-02-19 NOTE — Progress Notes (Signed)
Complete physical exam  Patient: Angel Ortega   DOB: [birth year]   71 y.o. Female  MRN: [id number]  Subjective:    Chief Complaint  Patient presents with   Annual Exam    Fasting     Angel Ortega is a 71 y.o. female who presents today for a complete physical exam. She reports consuming a general diet. Home exercise routine includes walking 3.5 hrs per week. Gym/ health club routine includes walking . She generally feels well. She reports sleeping well. She does have left knee pain after she exercises however the pain goes away fairly quickly.  No other joints are involved.  She does check her blood pressure at home and states that these numbers after she adjusts are below 130/80.  She does use Zyrtec for her allergies and using it more or less on an every other day for control.  She started to have some more difficulty with that recently.  She otherwise has no concerns or complaints.   Most recent fall risk assessment:    02/08/2022    9:03 AM  [place] in the past year? 0  Number falls in past yr: 0  Injury with Fall? 0  Risk for fall due to : Medication side effect  Follow up Falls evaluation completed;Education provided;Falls prevention discussed     Most recent depression screenings:    02/08/2022    9:03 AM 02/07/2021    9:48 AM  PHQ 2/9 Scores  PHQ - 2 Score 0 0  PHQ- 9 Score 0       Patient Active Problem List   Diagnosis Date Noted   Exotropia of left eye 03/09/2015   Hyperlipidemia LDL goal <130 03/09/2015   Allergic rhinitis, seasonal 12/24/2011   Obesity (BMI 30-39.9) 12/24/2011   Essential hypertension, benign 12/03/2010   Impaired fasting glucose 12/03/2010   Past Medical History:  Diagnosis Date   Fibroid    Heel spur    Hyperlipidemia    Staph infection 2012   Past Surgical History:  Procedure Laterality Date   ABDOMINAL HYSTERECTOMY  1996   TAH/FIBROIDS   EYE MUSCLE SURGERY  child   L eye   HERNIA REPAIR     MYOMECTOMY ABDOMINAL  APPROACH  1986   Staph infection  2012   Right thigh   Social History   Tobacco Use   Smoking status: Never   Smokeless tobacco: Never  Vaping Use   Vaping Use: Never used  Substance Use Topics   Alcohol use: No   Drug use: No   Family History  Problem Relation Age of Onset   Cancer Mother        lung cancer   Diabetes Father    Hypertension Father    Diabetes Sister    Cancer Brother        ?tumors in muscles   Hyperlipidemia Sister    Hypertension Sister    Hyperlipidemia Sister    Hypertension Sister    No Known Allergies    Patient Care Team: Denita Lung, MD as PCP - General (Family Medicine)   Outpatient Medications Prior to Visit  Medication Sig Note   Acetaminophen (TYLENOL ARTHRITIS PAIN PO) Take by mouth. 02/19/2022: Prn last dose about two months ago   cetirizine (ZYRTEC) 10 MG tablet Take 10 mg by mouth every other day. 02/19/2022: Prn last dose last week only take three nights a week   CLINPRO 5000 1.1 % PSTE  ibuprofen (ADVIL) 800 MG tablet TAKE 1 TABLET BY MOUTH EVERY 8 HOURS AS NEEDED FOR MODERATE PAIN. 02/19/2022: Prn last dose was last week   lisinopril-hydrochlorothiazide (ZESTORETIC) 20-25 MG tablet TAKE 1 TABLET EVERY DAY    Multiple Vitamins-Minerals (MULTIVITAMIN WITH MINERALS) tablet Take 1 tablet by mouth daily.    Omega-3 Fatty Acids (FISH OIL PO) Take 2 capsules by mouth daily.     mupirocin cream (BACTROBAN) 2 % Apply 1 application topically 2 (two) times daily. (Patient not taking: Reported on 02/07/2020)    Pseudoephedrine-Ibuprofen 30-200 MG TABS Take 2 tablets by mouth as needed. (Patient not taking: Reported on 02/07/2021) 02/19/2022: Prn last dose over one month   [DISCONTINUED] aspirin 81 MG tablet Take 81 mg by mouth 3 (three) times a week. (Patient not taking: Reported on 08/27/2021)    No facility-administered medications prior to visit.    Review of Systems  All other systems reviewed and are negative.         Objective:      BP (!) 140/70   Pulse 73   Temp (!) 97.5 F (36.4 C)   Ht 5' 2.5" (1.588 m)   Wt 206 lb 9.6 oz (93.7 kg)   LMP  (LMP Unknown)   SpO2 94%   BMI 37.19 kg/m  BP Readings from Last 3 Encounters:  02/19/22 (!) 140/70  08/27/21 (!) 180/92  07/16/21 (!) 162/80   Wt Readings from Last 3 Encounters:  02/19/22 206 lb 9.6 oz (93.7 kg)  02/08/22 213 lb (96.6 kg)  08/27/21 212 lb 9.6 oz (96.4 kg)   The 10-year ASCVD risk score (Arnett DK, et al., 2019) is: 16%   Values used to calculate the score:     Age: 56 years     Sex: Female     Is Non-Hispanic African American: Yes     Diabetic: No     Tobacco smoker: No     Systolic Blood Pressure: 735 mmHg     Is BP treated: Yes     HDL Cholesterol: 41 mg/dL     Total Cholesterol: 219 mg/dL  Physical Exam   Alert and in no distress.  Lateral deviation of the left eye is noted tympanic membranes and canals are normal. Pharyngeal area is normal. Neck is supple without adenopathy or thyromegaly. Cardiac exam shows a regular sinus rhythm without murmurs or gallops. Lungs are clear to auscultation.  Left knee exam shows no swelling or effusion.  Negative anterior drawer.  McMurray's testing negative.  Lab Results  Component Value Date   WBC 4.6 02/07/2021   HGB 12.4 02/07/2021   HCT 38.4 02/07/2021   MCV 89 02/07/2021   MCH 28.7 02/07/2021   RDW 13.7 02/07/2021   PLT 196 32/99/2426   Last metabolic panel Lab Results  Component Value Date   GLUCOSE 113 (H) 02/07/2021   NA 140 02/07/2021   K 4.0 02/07/2021   CL 103 02/07/2021   CO2 25 02/07/2021   BUN 23 02/07/2021   CREATININE 0.96 02/07/2021   EGFR 63 02/07/2021   CALCIUM 9.5 02/07/2021   PROT 7.3 02/07/2021   ALBUMIN 4.2 02/07/2021   LABGLOB 3.1 02/07/2021   AGRATIO 1.4 02/07/2021   BILITOT 0.5 02/07/2021   ALKPHOS 91 02/07/2021   AST 16 02/07/2021   ALT 17 02/07/2021   Last lipids Lab Results  Component Value Date   CHOL 219 (H) 02/07/2021   HDL 41 02/07/2021    LDLCALC 160 (H) 02/07/2021  TRIG 99 02/07/2021   CHOLHDL 5.3 (H) 02/07/2021        Assessment & Plan:   Encounter for Medicare annual wellness exam - Plan: CBC with Differential/Platelet, Comprehensive metabolic panel, Lipid panel  Essential hypertension, benign - Fritts coat htn - Plan: CBC with Differential/Platelet, Comprehensive metabolic panel  Seasonal allergic rhinitis, unspecified trigger  Hyperlipidemia LDL goal <130 - Plan: Lipid panel  Exotropia of left eye  Obesity (BMI 30-39.9)  Impaired fasting glucose  Left knee pain, unspecified chronicity  Essential hypertension - Plan: lisinopril-hydrochlorothiazide (ZESTORETIC) 20-25 MG tablet   Immunization History  Administered Date(s) Administered   Fluad Quad(high Dose 65+) 02/04/2019, 03/06/2020, 04/09/2021   Influenza, High Dose Seasonal PF 05/11/2014, 03/09/2015, 05/28/2016, 05/01/2018   Influenza,inj,Quad PF,6+ Mos 04/07/2017   PFIZER(Purple Top)SARS-COV-2 Vaccination 07/22/2019, 08/12/2019, 04/03/2020   Pfizer Covid-19 Vaccine Bivalent Booster 78yr & up 04/27/2021   Pneumococcal Conjugate-13 12/13/2013   Pneumococcal Polysaccharide-23 12/01/2008   Tdap 12/01/2008, 02/18/2020   Zoster Recombinat (Shingrix) 01/18/2020, 06/27/2020   Zoster, Live 12/01/2008    Health Maintenance  Topic Date Due   COVID-19 Vaccine (5 - Pfizer series) 08/25/2021   INFLUENZA VACCINE  01/15/2022   Fecal DNA (Cologuard)  02/14/2023   MAMMOGRAM  01/03/2024   TETANUS/TDAP  02/17/2030   DEXA SCAN  Completed   Hepatitis C Screening  Completed   Zoster Vaccines- Shingrix  Completed   HPV VACCINES  Aged Out   Pneumonia Vaccine 71 Years old  Discontinued   PAP SMEAR-Modifier  Discontinued    Discussed health benefits of physical activity, and encouraged her to engage in regular exercise appropriate for her age and condition.  Problem List Items Addressed This Visit     Allergic rhinitis, seasonal   Essential hypertension,  benign   Exotropia of left eye   Hyperlipidemia LDL goal <130   Impaired fasting glucose   Obesity (BMI 30-39.9)   Other Visit Diagnoses     Encounter for Medicare annual wellness exam    -  Primary     Discussed diet and exercise with her especially cutting back on carbohydrates.  Recommend Tylenol for the knee pain and if continued difficulty follow-up here.  Continue on present blood pressure medication and keep track of blood pressures at home. Return in about 1 year (around 02/20/2023) for awv/cpe.     JJill Alexanders MD

## 2022-02-19 NOTE — Patient Instructions (Addendum)
Take 2 Tylenol prior to exercise and see if that will help with your knee pain. Check on getting the flu vaccine as well as the updated COVID and RSV shots. Health Maintenance, Female Adopting a healthy lifestyle and getting preventive care are important in promoting health and wellness. Ask your health care provider about: The right schedule for you to have regular tests and exams. Things you can do on your own to prevent diseases and keep yourself healthy. What should I know about diet, weight, and exercise? Eat a healthy diet  Eat a diet that includes plenty of vegetables, fruits, low-fat dairy products, and lean protein. Do not eat a lot of foods that are high in solid fats, added sugars, or sodium. Maintain a healthy weight Body mass index (BMI) is used to identify weight problems. It estimates body fat based on height and weight. Your health care provider can help determine your BMI and help you achieve or maintain a healthy weight. Get regular exercise Get regular exercise. This is one of the most important things you can do for your health. Most adults should: Exercise for at least 150 minutes each week. The exercise should increase your heart rate and make you sweat (moderate-intensity exercise). Do strengthening exercises at least twice a week. This is in addition to the moderate-intensity exercise. Spend less time sitting. Even light physical activity can be beneficial. Watch cholesterol and blood lipids Have your blood tested for lipids and cholesterol at 73 years of age, then have this test every 5 years. Have your cholesterol levels checked more often if: Your lipid or cholesterol levels are high. You are older than 73 years of age. You are at high risk for heart disease. What should I know about cancer screening? Depending on your health history and family history, you may need to have cancer screening at various ages. This may include screening for: Breast cancer. Cervical  cancer. Colorectal cancer. Skin cancer. Lung cancer. What should I know about heart disease, diabetes, and high blood pressure? Blood pressure and heart disease High blood pressure causes heart disease and increases the risk of stroke. This is more likely to develop in people who have high blood pressure readings or are overweight. Have your blood pressure checked: Every 3-5 years if you are 34-86 years of age. Every year if you are 64 years old or older. Diabetes Have regular diabetes screenings. This checks your fasting blood sugar level. Have the screening done: Once every three years after age 73 if you are at a normal weight and have a low risk for diabetes. More often and at a younger age if you are overweight or have a high risk for diabetes. What should I know about preventing infection? Hepatitis B If you have a higher risk for hepatitis B, you should be screened for this virus. Talk with your health care provider to find out if you are at risk for hepatitis B infection. Hepatitis C Testing is recommended for: Everyone born from [birth year] through 1967. Anyone with known risk factors for hepatitis C. Sexually transmitted infections (STIs) Get screened for STIs, including gonorrhea and chlamydia, if: You are sexually active and are younger than 73 years of age. You are older than 73 years of age and your health care provider tells you that you are at risk for this type of infection. Your sexual activity has changed since you were last screened, and you are at increased risk for chlamydia or gonorrhea. Ask your health care provider if  you are at risk. Ask your health care provider about whether you are at high risk for HIV. Your health care provider may recommend a prescription medicine to help prevent HIV infection. If you choose to take medicine to prevent HIV, you should first get tested for HIV. You should then be tested every 3 months for as long as you are taking the  medicine. Pregnancy If you are about to stop having your period (premenopausal) and you may become pregnant, seek counseling before you get pregnant. Take 400 to 800 micrograms (mcg) of folic acid every day if you become pregnant. Ask for birth control (contraception) if you want to prevent pregnancy. Osteoporosis and menopause Osteoporosis is a disease in which the bones lose minerals and strength with aging. This can result in bone fractures. If you are 56 years old or older, or if you are at risk for osteoporosis and fractures, ask your health care provider if you should: Be screened for bone loss. Take a calcium or vitamin D supplement to lower your risk of fractures. Be given hormone replacement therapy (HRT) to treat symptoms of menopause. Follow these instructions at home: Alcohol use Do not drink alcohol if: Your health care provider tells you not to drink. You are pregnant, may be pregnant, or are planning to become pregnant. If you drink alcohol: Limit how much you have to: 0-1 drink a day. Know how much alcohol is in your drink. In the [place] one drink equals one 12 oz bottle of beer (355 mL), one 5 oz glass of wine (148 mL), or one 1 oz glass of hard liquor (44 mL). Lifestyle Do not use any products that contain nicotine or tobacco. These products include cigarettes, chewing tobacco, and vaping devices, such as e-cigarettes. If you need help quitting, ask your health care provider. Do not use street drugs. Do not share needles. Ask your health care provider for help if you need support or information about quitting drugs. General instructions Schedule regular health, dental, and eye exams. Stay current with your vaccines. Tell your health care provider if: You often feel depressed. You have ever been abused or do not feel safe at home. Summary Adopting a healthy lifestyle and getting preventive care are important in promoting health and wellness. Follow your health care  provider's instructions about healthy diet, exercising, and getting tested or screened for diseases. Follow your health care provider's instructions on monitoring your cholesterol and blood pressure. This information is not intended to replace advice given to you by your health care provider. Make sure you discuss any questions you have with your health care provider. Document Revised: 10/23/2020 Document Reviewed: 10/23/2020 Elsevier Patient Education  [place].

## 2022-02-20 LAB — CBC WITH DIFFERENTIAL/PLATELET
Basophils Absolute: 0 10*3/uL (ref 0.0–0.2)
Basos: 0 %
EOS (ABSOLUTE): 0.1 10*3/uL (ref 0.0–0.4)
Eos: 2 %
Hematocrit: 40.9 % (ref 34.0–46.6)
Hemoglobin: 13.2 g/dL (ref 11.1–15.9)
Immature Grans (Abs): 0 10*3/uL (ref 0.0–0.1)
Immature Granulocytes: 0 %
Lymphocytes Absolute: 1.5 10*3/uL (ref 0.7–3.1)
Lymphs: 31 %
MCH: 29.4 pg (ref 26.6–33.0)
MCHC: 32.3 g/dL (ref 31.5–35.7)
MCV: 91 fL (ref 79–97)
Monocytes Absolute: 0.4 10*3/uL (ref 0.1–0.9)
Monocytes: 8 %
Neutrophils Absolute: 2.9 10*3/uL (ref 1.4–7.0)
Neutrophils: 59 %
Platelets: 184 10*3/uL (ref 150–450)
RBC: 4.49 x10E6/uL (ref 3.77–5.28)
RDW: 13.6 % (ref 11.7–15.4)
WBC: 5 10*3/uL (ref 3.4–10.8)

## 2022-02-20 LAB — COMPREHENSIVE METABOLIC PANEL
ALT: 17 IU/L (ref 0–32)
AST: 16 IU/L (ref 0–40)
Albumin/Globulin Ratio: 1.5 (ref 1.2–2.2)
Albumin: 4.3 g/dL (ref 3.8–4.8)
Alkaline Phosphatase: 74 IU/L (ref 44–121)
BUN/Creatinine Ratio: 14 (ref 12–28)
BUN: 13 mg/dL (ref 8–27)
Bilirubin Total: 0.6 mg/dL (ref 0.0–1.2)
CO2: 25 mmol/L (ref 20–29)
Calcium: 9.6 mg/dL (ref 8.7–10.3)
Chloride: 105 mmol/L (ref 96–106)
Creatinine, Ser: 0.93 mg/dL (ref 0.57–1.00)
Globulin, Total: 2.8 g/dL (ref 1.5–4.5)
Glucose: 117 mg/dL — ABNORMAL HIGH (ref 70–99)
Potassium: 3.7 mmol/L (ref 3.5–5.2)
Sodium: 143 mmol/L (ref 134–144)
Total Protein: 7.1 g/dL (ref 6.0–8.5)
eGFR: 65 mL/min/{1.73_m2} (ref >59)

## 2022-02-20 LAB — LIPID PANEL
Chol/HDL Ratio: 5.4 ratio — ABNORMAL HIGH (ref 0.0–4.4)
Cholesterol, Total: 252 mg/dL — ABNORMAL HIGH (ref 100–199)
HDL: 47 mg/dL (ref >39)
LDL Chol Calc (NIH): 189 mg/dL — ABNORMAL HIGH (ref 0–99)
Triglycerides: 90 mg/dL (ref 0–149)
VLDL Cholesterol Cal: 16 mg/dL (ref 5–40)

## 2022-02-20 MED ORDER — ATORVASTATIN CALCIUM 20 MG PO TABS: 20.0000 mg | ORAL_TABLET | Freq: Every day | ORAL | 3 refills | Status: DC

## 2022-02-20 NOTE — Addendum Note (Signed)
Addended by: Denita Lung on: 02/20/2022 01:28 PM   Modules accepted: Orders

## 2022-03-04 ENCOUNTER — Other Ambulatory Visit (INDEPENDENT_AMBULATORY_CARE_PROVIDER_SITE_OTHER): Payer: Medicare PPO

## 2022-03-04 DIAGNOSIS — Z23 Encounter for immunization: Secondary | ICD-10-CM | POA: Diagnosis not present

## 2022-04-29 ENCOUNTER — Other Ambulatory Visit: Payer: Self-pay | Admitting: Medical

## 2022-04-29 ENCOUNTER — Telehealth: Payer: Self-pay | Admitting: Family Medicine

## 2022-04-29 MED ORDER — MUPIROCIN CALCIUM 2 % EX CREA: 1.0000 | TOPICAL_CREAM | Freq: Two times a day (BID) | CUTANEOUS | 0 refills | Status: DC

## 2022-04-29 NOTE — Telephone Encounter (Signed)
[place] pt requesting a refill on mupirocin to [id number] [place] [facility] [address]

## 2022-05-03 ENCOUNTER — Telehealth: Payer: Self-pay | Admitting: Family Medicine

## 2022-05-03 NOTE — Telephone Encounter (Signed)
P.A. MUPIROCIN approved til 06/17/23

## 2022-05-20 ENCOUNTER — Ambulatory Visit: Payer: Medicare PPO | Admitting: Family Medicine

## 2022-05-20 VITALS — BP 130/80 | HR 71 | Wt 211.2 lb

## 2022-05-20 DIAGNOSIS — E785 Hyperlipidemia, unspecified: Secondary | ICD-10-CM | POA: Diagnosis not present

## 2022-05-20 DIAGNOSIS — Z23 Encounter for immunization: Secondary | ICD-10-CM | POA: Diagnosis not present

## 2022-05-20 NOTE — Progress Notes (Signed)
   Subjective:    Patient ID: BRYAN OMURA, female    DOB: 09/29/1950, 71 y.o.   MRN: [id number]  HPI She is here for recheck.  She states she she has made some changes with her eating habits but they seem to be mainly revolving around cutting back on carbohydrates.  She alluded to increase physical activity but did not specify exactly what she was doing.   Review of Systems     Objective:   Physical Exam The 10-year ASCVD risk score (Arnett DK, et al., 2019) is: 16.7%   Values used to calculate the score:     Age: 71 years     Sex: Female     Is Non-Hispanic African American: Yes     Diabetic: No     Tobacco smoker: No     Systolic Blood Pressure: 962 mmHg     Is BP treated: Yes     HDL Cholesterol: 47 mg/dL     Total Cholesterol: 252 mg/dL   Alert and in no distress otherwise not examined     Assessment & Plan:  Hyperlipidemia LDL goal <130 - Plan: Lipid panel  Need for COVID-19 vaccine - Plan: [place] [facility] Fall 2023 Covid-19 Vaccine 58yr and older Discussed her cardiovascular risk.  And need for medication.  I will readdress this when we look at the new cholesterol numbers.  Also updated her COVID-vaccine

## 2022-05-21 LAB — LIPID PANEL
Chol/HDL Ratio: 4.8 ratio — ABNORMAL HIGH (ref 0.0–4.4)
Cholesterol, Total: 217 mg/dL — ABNORMAL HIGH (ref 100–199)
HDL: 45 mg/dL (ref >39)
LDL Chol Calc (NIH): 155 mg/dL — ABNORMAL HIGH (ref 0–99)
Triglycerides: 93 mg/dL (ref 0–149)
VLDL Cholesterol Cal: 17 mg/dL (ref 5–40)

## 2022-05-21 MED ORDER — ROSUVASTATIN CALCIUM 40 MG PO TABS: 40.0000 mg | ORAL_TABLET | Freq: Every day | ORAL | 3 refills | Status: DC

## 2022-05-21 NOTE — Addendum Note (Signed)
Addended by: Denita Lung on: 05/21/2022 01:24 PM   Modules accepted: Orders

## 2023-01-08 DIAGNOSIS — Z1231 Encounter for screening mammogram for malignant neoplasm of breast: Secondary | ICD-10-CM | POA: Diagnosis not present

## 2023-01-20 ENCOUNTER — Encounter: Payer: Self-pay | Admitting: Family Medicine

## 2023-01-28 ENCOUNTER — Ambulatory Visit (INDEPENDENT_AMBULATORY_CARE_PROVIDER_SITE_OTHER): Payer: Medicare PPO

## 2023-01-28 DIAGNOSIS — Z0000 Encounter for general adult medical examination without abnormal findings: Secondary | ICD-10-CM

## 2023-01-28 NOTE — Patient Instructions (Signed)
[name] , Thank you for taking time to come for your Medicare Wellness Visit. I appreciate your ongoing commitment to your health goals. Please review the following plan we discussed and let me know if I can assist you in the future.   Referrals/Orders/Follow-Ups/Clinician Recommendations: none  This is a list of the screening recommended for you and due dates:  Health Maintenance  Topic Date Due   COVID-19 Vaccine (6 - 2023-24 season) 09/19/2022   Flu Shot  01/16/2023   Cologuard (Stool DNA test)  02/14/2023   Medicare Annual Wellness Visit  01/28/2024   Mammogram  01/07/2025   DTaP/Tdap/Td vaccine (3 - Td or Tdap) 02/17/2030   DEXA scan (bone density measurement)  Completed   Hepatitis C Screening  Completed   Zoster (Shingles) Vaccine  Completed   HPV Vaccine  Aged Out   Pneumonia Vaccine  Discontinued   Pap Smear  Discontinued    Advanced directives: (In Chart) A copy of your advanced directives are scanned into your chart should your provider ever need it.  Next Medicare Annual Wellness Visit scheduled for next year: Yes  Preventive Care 64 Years and Older, Female Preventive care refers to lifestyle choices and visits with your health care provider that can promote health and wellness. What does preventive care include? A yearly physical exam. This is also called an annual well check. Dental exams once or twice a year. Routine eye exams. Ask your health care provider how often you should have your eyes checked. Personal lifestyle choices, including: Daily care of your teeth and gums. Regular physical activity. Eating a healthy diet. Avoiding tobacco and drug use. Limiting alcohol use. Practicing safe sex. Taking low-dose aspirin every day. Taking vitamin and mineral supplements as recommended by your health care provider. What happens during an annual well check? The services and screenings done by your health care provider during your annual well check will depend on  your age, overall health, lifestyle risk factors, and family history of disease. Counseling  Your health care provider may ask you questions about your: Alcohol use. Tobacco use. Drug use. Emotional well-being. Home and relationship well-being. Sexual activity. Eating habits. History of falls. Memory and ability to understand (cognition). Work and work Astronomer. Reproductive health. Screening  You may have the following tests or measurements: Height, weight, and BMI. Blood pressure. Lipid and cholesterol levels. These may be checked every 5 years, or more frequently if you are over 35 years old. Skin check. Lung cancer screening. You may have this screening every year starting at age 23 if you have a 30-pack-year history of smoking and currently smoke or have quit within the past 15 years. Fecal occult blood test (FOBT) of the stool. You may have this test every year starting at age 52. Flexible sigmoidoscopy or colonoscopy. You may have a sigmoidoscopy every 5 years or a colonoscopy every 10 years starting at age 71. Hepatitis C blood test. Hepatitis B blood test. Sexually transmitted disease (STD) testing. Diabetes screening. This is done by checking your blood sugar (glucose) after you have not eaten for a while (fasting). You may have this done every 1-3 years. Bone density scan. This is done to screen for osteoporosis. You may have this done starting at age 35. Mammogram. This may be done every 1-2 years. Talk to your health care provider about how often you should have regular mammograms. Talk with your health care provider about your test results, treatment options, and if necessary, the need for more  tests. Vaccines  Your health care provider may recommend certain vaccines, such as: Influenza vaccine. This is recommended every year. Tetanus, diphtheria, and acellular pertussis (Tdap, Td) vaccine. You may need a Td booster every 10 years. Zoster vaccine. You may need this  after age 39. Pneumococcal 13-valent conjugate (PCV13) vaccine. One dose is recommended after age 54. Pneumococcal polysaccharide (PPSV23) vaccine. One dose is recommended after age 80. Talk to your health care provider about which screenings and vaccines you need and how often you need them. This information is not intended to replace advice given to you by your health care provider. Make sure you discuss any questions you have with your health care provider. Document Released: 06/30/2015 Document Revised: 02/21/2016 Document Reviewed: 04/04/2015 Elsevier Interactive Patient Education  2017 [employer].  Fall Prevention in the Home Falls can cause injuries. They can happen to people of all ages. There are many things you can do to make your home safe and to help prevent falls. What can I do on the outside of my home? Regularly fix the edges of walkways and driveways and fix any cracks. Remove anything that might make you trip as you walk through a door, such as a raised step or threshold. Trim any bushes or trees on the path to your home. Use bright outdoor lighting. Clear any walking paths of anything that might make someone trip, such as rocks or tools. Regularly check to see if handrails are loose or broken. Make sure that both sides of any steps have handrails. Any raised decks and porches should have guardrails on the edges. Have any leaves, snow, or ice cleared regularly. Use sand or salt on walking paths during winter. Clean up any spills in your garage right away. This includes oil or grease spills. What can I do in the bathroom? Use night lights. Install grab bars by the toilet and in the tub and shower. Do not use towel bars as grab bars. Use non-skid mats or decals in the tub or shower. If you need to sit down in the shower, use a plastic, non-slip stool. Keep the floor dry. Clean up any water that spills on the floor as soon as it happens. Remove soap buildup in the tub or  shower regularly. Attach bath mats securely with double-sided non-slip rug tape. Do not have throw rugs and other things on the floor that can make you trip. What can I do in the bedroom? Use night lights. Make sure that you have a light by your bed that is easy to reach. Do not use any sheets or blankets that are too big for your bed. They should not hang down onto the floor. Have a firm chair that has side arms. You can use this for support while you get dressed. Do not have throw rugs and other things on the floor that can make you trip. What can I do in the kitchen? Clean up any spills right away. Avoid walking on wet floors. Keep items that you use a lot in easy-to-reach places. If you need to reach something above you, use a strong step stool that has a grab bar. Keep electrical cords out of the way. Do not use floor polish or wax that makes floors slippery. If you must use wax, use non-skid floor wax. Do not have throw rugs and other things on the floor that can make you trip. What can I do with my stairs? Do not leave any items on the stairs. Make sure  that there are handrails on both sides of the stairs and use them. Fix handrails that are broken or loose. Make sure that handrails are as long as the stairways. Check any carpeting to make sure that it is firmly attached to the stairs. Fix any carpet that is loose or worn. Avoid having throw rugs at the top or bottom of the stairs. If you do have throw rugs, attach them to the floor with carpet tape. Make sure that you have a light switch at the top of the stairs and the bottom of the stairs. If you do not have them, ask someone to add them for you. What else can I do to help prevent falls? Wear shoes that: Do not have high heels. Have rubber bottoms. Are comfortable and fit you well. Are closed at the toe. Do not wear sandals. If you use a stepladder: Make sure that it is fully opened. Do not climb a closed stepladder. Make  sure that both sides of the stepladder are locked into place. Ask someone to hold it for you, if possible. Clearly mark and make sure that you can see: Any grab bars or handrails. First and last steps. Where the edge of each step is. Use tools that help you move around (mobility aids) if they are needed. These include: Canes. Walkers. Scooters. Crutches. Turn on the lights when you go into a dark area. Replace any light bulbs as soon as they burn out. Set up your furniture so you have a clear path. Avoid moving your furniture around. If any of your floors are uneven, fix them. If there are any pets around you, be aware of where they are. Review your medicines with your doctor. Some medicines can make you feel dizzy. This can increase your chance of falling. Ask your doctor what other things that you can do to help prevent falls. This information is not intended to replace advice given to you by your health care provider. Make sure you discuss any questions you have with your health care provider. Document Released: 03/30/2009 Document Revised: 11/09/2015 Document Reviewed: 07/08/2014 Elsevier Interactive Patient Education  2017 [employer].

## 2023-01-28 NOTE — Progress Notes (Signed)
Subjective:   Angel Ortega is a 72 y.o. female who presents for Medicare Annual (Subsequent) preventive examination.  Visit Complete: Virtual  I connected with  Angel Ortega on 01/28/23 by a audio enabled telemedicine application and verified that I am speaking with the correct person using two identifiers.  Patient Location: Home  Provider Location: Office/Clinic  I discussed the limitations of evaluation and management by telemedicine. The patient expressed understanding and agreed to proceed.  Vital Signs: Unable to obtain new vitals due to this being a telehealth visit.  Review of Systems     Cardiac Risk Factors include: advanced age (>20men, >72 women);dyslipidemia;hypertension     Objective:    Today's Vitals   There is no height or weight on file to calculate BMI.     01/28/2023    9:33 AM 02/08/2022    9:02 AM 02/07/2021    9:47 AM 02/07/2020   12:56 PM 02/04/2019    9:04 AM 12/09/2017   10:04 AM 01/13/2017   11:04 AM  Advanced Directives  Does Patient Have a Medical Advance Directive? Yes Yes Yes No No No No  Type of Estate agent of [place];Living will Healthcare Power of [place];Living will Living will      Does patient want to make changes to medical advance directive?   No - Patient declined      Copy of Healthcare Power of Attorney in Chart? Yes - validated most recent copy scanned in chart (See row information) Yes - validated most recent copy scanned in chart (See row information)       Would patient like information on creating a medical advance directive?    Yes (MAU/Ambulatory/Procedural Areas - Information given) Yes (MAU/Ambulatory/Procedural Areas - Information given) Yes (MAU/Ambulatory/Procedural Areas - Information given) Yes (MAU/Ambulatory/Procedural Areas - Information given)    Current Medications (verified) Outpatient Encounter Medications as of 01/28/2023  Medication Sig   Acetaminophen (TYLENOL ARTHRITIS PAIN PO)  Take by mouth.   cetirizine (ZYRTEC) 10 MG tablet Take 10 mg by mouth every other day.   CLINPRO 5000 1.1 % PSTE    lisinopril-hydrochlorothiazide (ZESTORETIC) 20-25 MG tablet Take 1 tablet by mouth daily.   Multiple Vitamins-Minerals (MULTIVITAMIN WITH MINERALS) tablet Take 1 tablet by mouth daily.   mupirocin cream (BACTROBAN) 2 % Apply 1 Application topically 2 (two) times daily.   Omega-3 Fatty Acids (FISH OIL PO) Take 2 capsules by mouth daily.    rosuvastatin (CRESTOR) 40 MG tablet Take 1 tablet (40 mg total) by mouth daily.   ibuprofen (ADVIL) 800 MG tablet TAKE 1 TABLET BY MOUTH EVERY 8 HOURS AS NEEDED FOR MODERATE PAIN. (Patient not taking: Reported on 01/28/2023)   Pseudoephedrine-Ibuprofen 30-200 MG TABS Take 2 tablets by mouth as needed. (Patient not taking: Reported on 01/28/2023)   No facility-administered encounter medications on file as of 01/28/2023.    Allergies (verified) Patient has no known allergies.   History: Past Medical History:  Diagnosis Date   Fibroid    Heel spur    Hyperlipidemia    Staph infection 2012   Past Surgical History:  Procedure Laterality Date   ABDOMINAL HYSTERECTOMY  1996   TAH/FIBROIDS   EYE MUSCLE SURGERY  child   L eye   HERNIA REPAIR     MYOMECTOMY ABDOMINAL APPROACH  1986   Staph infection  2012   Right thigh   Family History  Problem Relation Age of Onset   Cancer Mother  lung cancer   Diabetes Father    Hypertension Father    Diabetes Sister    Cancer Brother        ?tumors in muscles   Hyperlipidemia Sister    Hypertension Sister    Hyperlipidemia Sister    Hypertension Sister    Social History   Socioeconomic History   Marital status: Married    Spouse name: Not on file   Number of children: Not on file   Years of education: Not on file   Highest education level: Not on file  Occupational History   Not on file  Tobacco Use   Smoking status: Never   Smokeless tobacco: Never  Vaping Use   Vaping  status: Never Used  Substance and Sexual Activity   Alcohol use: No   Drug use: No   Sexual activity: Yes    Birth control/protection: Surgical    Comment: 72 YEARS OLD, NO MORE THAN 5 PARTNERS  Other Topics Concern   Not on file  Social History Narrative   Not on file   Social Determinants of Health   Financial Resource Strain: Low Risk  (01/28/2023)   Overall Financial Resource Strain (CARDIA)    Difficulty of Paying Living Expenses: Not hard at all  Food Insecurity: No Food Insecurity (01/28/2023)   Hunger Vital Sign    Worried About Running Out of Food in the Last Year: Never true    Ran Out of Food in the Last Year: Never true  Transportation Needs: No Transportation Needs (01/28/2023)   PRAPARE - Administrator, Civil Service (Medical): No    Lack of Transportation (Non-Medical): No  Physical Activity: Insufficiently Active (01/28/2023)   Exercise Vital Sign    Days of Exercise per Week: 3 days    Minutes of Exercise per Session: 30 min  Stress: No Stress Concern Present (01/28/2023)   [employer] of Occupational Health - Occupational Stress Questionnaire    Feeling of Stress : Not at all  Social Connections: Moderately Integrated (01/28/2023)   Social Connection and Isolation Panel [NHANES]    Frequency of Communication with Friends and Family: More than three times a week    Frequency of Social Gatherings with Friends and Family: Once a week    Attends Religious Services: More than 4 times per year    Active Member of [employer] or Organizations: No    Attends Engineer, structural: Never    Marital Status: Married    Tobacco Counseling Counseling given: Not Answered   Clinical Intake:  Pre-visit preparation completed: Yes  Pain : No/denies pain     Nutritional Risks: None Diabetes: No  How often do you need to have someone help you when you read instructions, pamphlets, or other written materials from your doctor or pharmacy?: 1 -  Never  Interpreter Needed?: No  Information entered by :: NAllen LPN   Activities of Daily Living    01/28/2023    9:29 AM 02/08/2022    9:04 AM  In your present state of health, do you have any difficulty performing the following activities:  Hearing? 0 0  Vision? 0 0  Difficulty concentrating or making decisions? 0 0  Walking or climbing stairs? 0 0  Dressing or bathing? 0 0  Doing errands, shopping? 0 0  Preparing Food and eating ? N N  Using the Toilet? N N  In the past six months, have you accidently leaked urine? N N  Do you have  problems with loss of bowel control? N N  Managing your Medications? N N  Managing your Finances? N N  Housekeeping or managing your Housekeeping? N N    Patient Care Team: Ronnald Nian, MD as PCP - General (Family Medicine)  Indicate any recent Medical Services you may have received from other than [facility] providers in the past year (date may be approximate).     Assessment:   This is a routine wellness examination for Alisea.  Hearing/Vision screen Hearing Screening - Comments:: Denies hearing issues Vision Screening - Comments:: Regular eye exams, Dr. Windy Canny  Dietary issues and exercise activities discussed:     Goals Addressed             This Visit's Progress    Patient Stated       01/28/2023, wants to lose weight       Depression Screen    01/28/2023    9:37 AM 02/08/2022    9:03 AM 02/07/2021    9:48 AM 02/07/2020    9:30 AM 02/04/2019    8:27 AM 12/09/2017    9:39 AM 01/13/2017   10:21 AM  PHQ 2/9 Scores  PHQ - 2 Score 0 0 0 0 0 0 0  PHQ- 9 Score 0 0         Fall Risk    01/28/2023    9:36 AM 02/08/2022    9:03 AM 02/07/2021    9:48 AM 02/07/2020    9:30 AM 02/04/2019    8:26 AM  Fall Risk   Falls in the past year? 0 0 0 0 0  Number falls in past yr: 0 0 0 0   Injury with Fall? 0 0 0 0   Risk for fall due to : Medication side effect Medication side effect No Fall Risks    Follow up Falls prevention  discussed;Falls evaluation completed Falls evaluation completed;Education provided;Falls prevention discussed Falls evaluation completed      MEDICARE RISK AT HOME:  Medicare Risk at Home - 01/28/23 0936     Any stairs in or around the home? Yes    If so, are there any without handrails? No    Home free of loose throw rugs in walkways, pet beds, electrical cords, etc? Yes    Adequate lighting in your home to reduce risk of falls? Yes    Life alert? No    Use of a cane, walker or w/c? No    Grab bars in the bathroom? Yes    Shower chair or bench in shower? No    Elevated toilet seat or a handicapped toilet? No             TIMED UP AND GO:  Was the test performed?  No    Cognitive Function:        01/28/2023    9:37 AM 02/08/2022    9:05 AM  6CIT Screen  What Year? 0 points 0 points  What month? 0 points 0 points  What time? 0 points 0 points  Count back from 20 0 points 0 points  Months in reverse 0 points 2 points  Repeat phrase 0 points 0 points  Total Score 0 points 2 points    Immunizations Immunization History  Administered Date(s) Administered   COVID-19, mRNA, vaccine(Comirnaty)12 years and older 05/20/2022   Fluad Quad(high Dose 65+) 02/04/2019, 03/06/2020, 04/09/2021, 03/04/2022   Influenza, High Dose Seasonal PF 05/11/2014, 03/09/2015, 05/28/2016, 05/01/2018   Influenza,inj,Quad PF,6+ Mos 04/07/2017  PFIZER(Purple Top)SARS-COV-2 Vaccination 07/22/2019, 08/12/2019, 04/03/2020   Pfizer Covid-19 Vaccine Bivalent Booster 28yrs & up 04/27/2021   Pneumococcal Conjugate-13 12/13/2013   Pneumococcal Polysaccharide-23 12/01/2008   Tdap 12/01/2008, 02/18/2020   Zoster Recombinant(Shingrix) 01/18/2020, 06/27/2020   Zoster, Live 12/01/2008    TDAP status: Up to date  Flu Vaccine status: Due, Education has been provided regarding the importance of this vaccine. Advised may receive this vaccine at local pharmacy or Health Dept. Aware to provide a copy of the  vaccination record if obtained from local pharmacy or Health Dept. Verbalized acceptance and understanding.  Pneumococcal vaccine status: Up to date  Covid-19 vaccine status: Completed vaccines  Qualifies for Shingles Vaccine? Yes   Zostavax completed Yes   Shingrix Completed?: Yes  Screening Tests Health Maintenance  Topic Date Due   COVID-19 Vaccine (6 - 2023-24 season) 09/19/2022   INFLUENZA VACCINE  01/16/2023   Fecal DNA (Cologuard)  02/14/2023   Medicare Annual Wellness (AWV)  01/28/2024   MAMMOGRAM  01/07/2025   DTaP/Tdap/Td (3 - Td or Tdap) 02/17/2030   DEXA SCAN  Completed   Hepatitis C Screening  Completed   Zoster Vaccines- Shingrix  Completed   HPV VACCINES  Aged Out   Pneumonia Vaccine 22+ Years old  Discontinued   PAP SMEAR-Modifier  Discontinued    Health Maintenance  Health Maintenance Due  Topic Date Due   COVID-19 Vaccine (6 - 2023-24 season) 09/19/2022   INFLUENZA VACCINE  01/16/2023    Colorectal cancer screening: Type of screening: Cologuard. Completed 02/14/2020. Repeat every 3 years  Mammogram status: Completed 01/08/2023. Repeat every year  Bone Density status: Completed 05/31/2020.   Lung Cancer Screening: (Low Dose CT Chest recommended if Age 15-80 years, 20 pack-year currently smoking OR have quit w/in 15years.) does not qualify.   Lung Cancer Screening Referral: no  Additional Screening:  Hepatitis C Screening: does qualify; Completed 11/27/2015  Vision Screening: Recommended annual ophthalmology exams for early detection of glaucoma and other disorders of the eye. Is the patient up to date with their annual eye exam?  Yes  Who is the provider or what is the name of the office in which the patient attends annual eye exams? Dr. Windy Canny If pt is not established with a provider, would they like to be referred to a provider to establish care? No .   Dental Screening: Recommended annual dental exams for proper oral hygiene  Diabetic Foot Exam:  n/a  Community Resource Referral / Chronic Care Management: CRR required this visit?  No   CCM required this visit?  No     Plan:     I have personally reviewed and noted the following in the patient's chart:   Medical and social history Use of alcohol, tobacco or illicit drugs  Current medications and supplements including opioid prescriptions. Patient is not currently taking opioid prescriptions. Functional ability and status Nutritional status Physical activity Advanced directives List of other physicians Hospitalizations, surgeries, and ER visits in previous 12 months Vitals Screenings to include cognitive, depression, and falls Referrals and appointments  In addition, I have reviewed and discussed with patient certain preventive protocols, quality metrics, and best practice recommendations. A written personalized care plan for preventive services as well as general preventive health recommendations were provided to patient.     Barb Merino, LPN   [id number]   After Visit Summary: (Pick Up) Due to this being a telephonic visit, with patients personalized plan was offered to patient and patient has requested to  Pick up at office.  Nurse Notes: none

## 2023-02-24 ENCOUNTER — Ambulatory Visit: Payer: Medicare PPO | Admitting: Family Medicine

## 2023-02-25 ENCOUNTER — Encounter: Payer: Self-pay | Admitting: Family Medicine

## 2023-02-25 ENCOUNTER — Ambulatory Visit (INDEPENDENT_AMBULATORY_CARE_PROVIDER_SITE_OTHER): Payer: Medicare PPO | Admitting: Family Medicine

## 2023-02-25 VITALS — BP 130/82 | HR 63 | Ht 63.0 in | Wt 209.2 lb

## 2023-02-25 DIAGNOSIS — Z1211 Encounter for screening for malignant neoplasm of colon: Secondary | ICD-10-CM

## 2023-02-25 DIAGNOSIS — Z23 Encounter for immunization: Secondary | ICD-10-CM | POA: Insufficient documentation

## 2023-02-25 DIAGNOSIS — E669 Obesity, unspecified: Secondary | ICD-10-CM | POA: Diagnosis not present

## 2023-02-25 DIAGNOSIS — I10 Essential (primary) hypertension: Secondary | ICD-10-CM

## 2023-02-25 DIAGNOSIS — E785 Hyperlipidemia, unspecified: Secondary | ICD-10-CM

## 2023-02-25 DIAGNOSIS — H50112 Monocular exotropia, left eye: Secondary | ICD-10-CM | POA: Diagnosis not present

## 2023-02-25 DIAGNOSIS — R7301 Impaired fasting glucose: Secondary | ICD-10-CM

## 2023-02-25 DIAGNOSIS — Z0000 Encounter for general adult medical examination without abnormal findings: Secondary | ICD-10-CM | POA: Diagnosis not present

## 2023-02-25 DIAGNOSIS — J302 Other seasonal allergic rhinitis: Secondary | ICD-10-CM

## 2023-02-25 NOTE — Progress Notes (Signed)
Angel Ortega is a 72 y.o. female who presents for annual wellness visit and follow-up on chronic medical conditions.  She has no particular concerns or complaints.  She does exercise on a daily basis.  She does occasionally use Zyrtec for her allergies.  Continues on Crestor for her lipids.  She is taking a multivitamin.  She is taking lisinopril/HCTZ for her blood pressure.  She is married and enjoying her retirement.  Immunizations and Health Maintenance Immunization History  Administered Date(s) Administered   COVID-19, mRNA, vaccine(Comirnaty)12 years and older 05/20/2022   Fluad Quad(high Dose 65+) 02/04/2019, 03/06/2020, 04/09/2021, 03/04/2022   Influenza, High Dose Seasonal PF 05/11/2014, 03/09/2015, 05/28/2016, 05/01/2018   Influenza,inj,Quad PF,6+ Mos 04/07/2017   PFIZER(Purple Top)SARS-COV-2 Vaccination 07/22/2019, 08/12/2019, 04/03/2020   Pfizer Covid-19 Vaccine Bivalent Booster 61yrs & up 04/27/2021   Pneumococcal Conjugate-13 12/13/2013   Pneumococcal Polysaccharide-23 12/01/2008   Tdap 12/01/2008, 02/18/2020   Zoster Recombinant(Shingrix) 01/18/2020, 06/27/2020   Zoster, Live 12/01/2008   Health Maintenance Due  Topic Date Due   INFLUENZA VACCINE  01/16/2023   Fecal DNA (Cologuard)  02/14/2023   COVID-19 Vaccine (6 - 2023-24 season) 02/16/2023    Last Pap smear: 2011 Last mammogram: 2024 Last colonoscopy: 2007 Last DEXA: Dentist: Dr. Caesar Chestnut Ophtho: Dr. Iran Sizer Exercise: walk 4- 5 days out of week for 30 minutes  Other doctors caring for patient include:  Advanced directives:?    Depression screen:  See questionnaire below.     01/28/2023    9:37 AM 02/08/2022    9:03 AM 02/07/2021    9:48 AM 02/07/2020    9:30 AM 02/04/2019    8:27 AM  Depression screen PHQ 2/9  Decreased Interest 0 0 0 0 0  Down, Depressed, Hopeless 0 0 0 0 0  PHQ - 2 Score 0 0 0 0 0  Altered sleeping 0 0     Tired, decreased energy 0 0     Change in appetite 0 0     Feeling bad or  failure about yourself  0 0     Trouble concentrating 0 0     Moving slowly or fidgety/restless 0 0     Suicidal thoughts 0 0     PHQ-9 Score 0 0     Difficult doing work/chores  Not difficult at all       Fall Risk Screen: see questionnaire below.    01/28/2023    9:36 AM 02/08/2022    9:03 AM 02/07/2021    9:48 AM 02/07/2020    9:30 AM 02/04/2019    8:26 AM  Fall Risk   Falls in the past year? 0 0 0 0 0  Number falls in past yr: 0 0 0 0   Injury with Fall? 0 0 0 0   Risk for fall due to : Medication side effect Medication side effect No Fall Risks    Follow up Falls prevention discussed;Falls evaluation completed Falls evaluation completed;Education provided;Falls prevention discussed Falls evaluation completed      ADL screen:  See questionnaire below Functional Status Survey:     Review of Systems Constitutional: -, -unexpected weight change, -anorexia, -fatigue Allergy: -sneezing, -itching, -congestion Dermatology: denies changing moles, rash, lumps ENT: -runny nose, -ear pain, -sore throat,  Cardiology:  -chest pain, -palpitations, -orthopnea, Respiratory: -cough, -shortness of breath, -dyspnea on exertion, -wheezing,  Gastroenterology: -abdominal pain, -nausea, -vomiting, -diarrhea, -constipation, -dysphagia Hematology: -bleeding or bruising problems Musculoskeletal: -arthralgias, -myalgias, -joint swelling, -back pain, - Ophthalmology: -vision changes,  Urology: -dysuria, -difficulty urinating,  -urinary frequency, -urgency, incontinence Neurology: -, -numbness, , -memory loss, -falls, -dizziness    PHYSICAL EXAM:    General Appearance: Alert, cooperative, no distress, appears stated age Head: Normocephalic, without obvious abnormality, atraumatic Eyes: PERRL, conjunctiva/corneas clear, EOM's intact, fEars: Normal TM's and external ear canals; esotropia noted Nose: Nares normal, mucosa normal, no drainage or sinus tenderness Throat: Lips, mucosa, and tongue  normal; teeth and gums normal Neck: Supple, no lymphadenopathy;  thyroid:  no enlargement/tenderness/nodules; no carotid bruit or JVD Lungs: Clear to auscultation bilaterally without wheezes, rales or ronchi; respirations unlabored Heart: Regular rate and rhythm, S1 and S2 normal, no murmur, rubor gallop Skin:  Skin color, texture, turgor normal, no rashes or lesions Lymph nodes: Cervical, supraclavicular, and axillary nodes normal Neurologic:  CNII-XII intact, normal strength, sensation and gait; reflexes 2+ and symmetric throughout Psych: Normal mood, affect, hygiene and grooming.  ASSESSMENT/PLAN: Obesity (BMI 30-39.9)  Impaired fasting glucose  Hyperlipidemia LDL goal <130 - Plan: Lipid panel  Exotropia of left eye  Essential hypertension, benign - Plan: CBC with Differential/Platelet, Comprehensive metabolic panel  Seasonal allergic rhinitis, unspecified trigger  Screening for colon cancer - Plan: Cologuard  Need for influenza vaccination - Plan: Flu Vaccine Trivalent High Dose (Fluad)    Discussed  at least 30 minutes of aerobic activity at least 5 days/week and weight-bearing exercise 2x/week; ; healthy diet, including goals of calcium and vitamin D intake  Immunization recommendations discussed.  Colonoscopy recommendations reviewed   Medicare Attestation I have personally reviewed: The patient's medical and social history Their use of alcohol, tobacco or illicit drugs Their current medications and supplements The patient's functional ability including ADLs,fall risks, home safety risks, cognitive, and hearing and visual impairment Diet and physical activities Evidence for depression or mood disorders  The patient's weight, height, and BMI have been recorded in the chart.  I have made referrals, counseling, and provided education to the patient based on review of the above and I have provided the patient with a written personalized care plan for preventive services.      Sharlot Gowda, MD   02/25/2023

## 2023-02-26 LAB — CBC WITH DIFFERENTIAL/PLATELET
Basophils Absolute: 0 10*3/uL (ref 0.0–0.2)
Basos: 0 %
EOS (ABSOLUTE): 0.2 10*3/uL (ref 0.0–0.4)
Eos: 3 %
Hematocrit: 42 % (ref 34.0–46.6)
Hemoglobin: 13.2 g/dL (ref 11.1–15.9)
Immature Grans (Abs): 0 10*3/uL (ref 0.0–0.1)
Immature Granulocytes: 0 %
Lymphocytes Absolute: 1.8 10*3/uL (ref 0.7–3.1)
Lymphs: 33 %
MCH: 29.3 pg (ref 26.6–33.0)
MCHC: 31.4 g/dL — ABNORMAL LOW (ref 31.5–35.7)
MCV: 93 fL (ref 79–97)
Monocytes Absolute: 0.5 10*3/uL (ref 0.1–0.9)
Monocytes: 9 %
Neutrophils Absolute: 3.1 10*3/uL (ref 1.4–7.0)
Neutrophils: 55 %
Platelets: 192 10*3/uL (ref 150–450)
RBC: 4.5 x10E6/uL (ref 3.77–5.28)
RDW: 13.1 % (ref 11.7–15.4)
WBC: 5.6 10*3/uL (ref 3.4–10.8)

## 2023-02-26 LAB — COMPREHENSIVE METABOLIC PANEL
ALT: 24 IU/L (ref 0–32)
AST: 20 IU/L (ref 0–40)
Albumin: 4.3 g/dL (ref 3.8–4.8)
Alkaline Phosphatase: 77 IU/L (ref 44–121)
BUN/Creatinine Ratio: 21 (ref 12–28)
BUN: 17 mg/dL (ref 8–27)
Bilirubin Total: 0.5 mg/dL (ref 0.0–1.2)
CO2: 24 mmol/L (ref 20–29)
Calcium: 9.5 mg/dL (ref 8.7–10.3)
Chloride: 102 mmol/L (ref 96–106)
Creatinine, Ser: 0.82 mg/dL (ref 0.57–1.00)
Globulin, Total: 2.8 g/dL (ref 1.5–4.5)
Glucose: 84 mg/dL (ref 70–99)
Potassium: 3.9 mmol/L (ref 3.5–5.2)
Sodium: 143 mmol/L (ref 134–144)
Total Protein: 7.1 g/dL (ref 6.0–8.5)
eGFR: 75 mL/min/{1.73_m2} (ref >59)

## 2023-02-26 LAB — LIPID PANEL
Chol/HDL Ratio: 2.9 ratio (ref 0.0–4.4)
Cholesterol, Total: 145 mg/dL (ref 100–199)
HDL: 50 mg/dL (ref >39)
LDL Chol Calc (NIH): 83 mg/dL (ref 0–99)
Triglycerides: 54 mg/dL (ref 0–149)
VLDL Cholesterol Cal: 12 mg/dL (ref 5–40)

## 2023-03-03 ENCOUNTER — Other Ambulatory Visit (INDEPENDENT_AMBULATORY_CARE_PROVIDER_SITE_OTHER): Payer: Medicare PPO

## 2023-03-03 DIAGNOSIS — Z23 Encounter for immunization: Secondary | ICD-10-CM

## 2023-03-10 DIAGNOSIS — Z1211 Encounter for screening for malignant neoplasm of colon: Secondary | ICD-10-CM | POA: Diagnosis not present

## 2023-03-13 LAB — COLOGUARD: COLOGUARD: NEGATIVE

## 2023-05-06 ENCOUNTER — Other Ambulatory Visit: Payer: Self-pay | Admitting: Family Medicine

## 2023-05-06 ENCOUNTER — Other Ambulatory Visit: Payer: Self-pay | Admitting: *Deleted

## 2023-05-06 ENCOUNTER — Telehealth: Payer: Self-pay | Admitting: Family Medicine

## 2023-05-06 DIAGNOSIS — I10 Essential (primary) hypertension: Secondary | ICD-10-CM

## 2023-05-06 NOTE — Telephone Encounter (Signed)
Pt called and is requesting refills on her Zestoreic And states she is taking lipitor Please send to the  [facility] - [place], [place] - [address]

## 2023-05-06 NOTE — Telephone Encounter (Signed)
Done

## 2024-01-14 DIAGNOSIS — Z1231 Encounter for screening mammogram for malignant neoplasm of breast: Secondary | ICD-10-CM | POA: Diagnosis not present

## 2024-01-14 LAB — HM MAMMOGRAPHY

## 2024-02-03 ENCOUNTER — Ambulatory Visit: Payer: Medicare PPO

## 2024-02-03 DIAGNOSIS — Z0000 Encounter for general adult medical examination without abnormal findings: Secondary | ICD-10-CM

## 2024-02-03 NOTE — Patient Instructions (Signed)
 [name] , Thank you for taking time out of your busy schedule to complete your Annual Wellness Visit with me. I enjoyed our conversation and look forward to speaking with you again next year. I, as well as your care team,  appreciate your ongoing commitment to your health goals. Please review the following plan we discussed and let me know if I can assist you in the future. Your Game plan/ To Do List    Referrals: If you haven't heard from the office you've been referred to, please reach out to them at the phone provided.   Follow up Visits: We will see or speak with you next year for your Next Medicare AWV with our clinical staff Have you seen your provider in the last 6 months (3 months if uncontrolled diabetes)? No  Clinician Recommendations:  Aim for 30 minutes of exercise or brisk walking, 6-8 glasses of water, and 5 servings of fruits and vegetables each day.       This is a list of the screenings recommended for you:  Health Maintenance  Topic Date Due   Pap Smear  12/26/2010   COVID-19 Vaccine (6 - 2024-25 season) 02/16/2023   Flu Shot  01/16/2024   Medicare Annual Wellness Visit  02/02/2025   Cologuard (Stool DNA test)  03/09/2026   DTaP/Tdap/Td vaccine (3 - Td or Tdap) 02/17/2030   DEXA scan (bone density measurement)  Completed   Hepatitis C Screening  Completed   Zoster (Shingles) Vaccine  Completed   HPV Vaccine  Aged Out   Meningitis B Vaccine  Aged Out   Pneumococcal Vaccine for age over 41  Discontinued    Advanced directives: (In Chart) A copy of your advanced directives are scanned into your chart should your provider ever need it. Advance Care Planning is important because it:  [x]  Makes sure you receive the medical care that is consistent with your values, goals, and preferences  [x]  It provides guidance to your family and loved ones and reduces their decisional burden about whether or not they are making the right decisions based on your wishes.  Follow the  link provided in your after visit summary or read over the paperwork we have mailed to you to help you started getting your Advance Directives in place. If you need assistance in completing these, please reach out to us  so that we can help you!  See attachments for Preventive Care and Fall Prevention Tips.

## 2024-02-03 NOTE — Progress Notes (Signed)
 Subjective:   Angel Ortega is a 73 y.o. who presents for a Medicare Wellness preventive visit.  As a reminder, Annual Wellness Visits don't include a physical exam, and some assessments may be limited, especially if this visit is performed virtually. We may recommend an in-person follow-up visit with your provider if needed.  Visit Complete: Virtual I connected with  Angel Ortega on 02/03/24 by a audio enabled telemedicine application and verified that I am speaking with the correct person using two identifiers.  Patient Location: Home  Provider Location: Office/Clinic  I discussed the limitations of evaluation and management by telemedicine. The patient expressed understanding and agreed to proceed.  Vital Signs: Because this visit was a virtual/telehealth visit, some criteria may be missing or patient reported. Any vitals not documented were not able to be obtained and vitals that have been documented are patient reported.  VideoError- Librarian, academic were attempted between this provider and patient, however failed, due to patient having technical difficulties OR patient did not have access to video capability.  We continued and completed visit with audio only.   Persons Participating in Visit: Patient.  AWV Questionnaire: No: Patient Medicare AWV questionnaire was not completed prior to this visit.  Cardiac Risk Factors include: advanced age (>68men, >106 women);dyslipidemia;hypertension     Objective:    Today's Vitals   There is no height or weight on file to calculate BMI.     02/03/2024   10:13 AM 01/28/2023    9:33 AM 02/08/2022    9:02 AM 02/07/2021    9:47 AM 02/07/2020   12:56 PM 02/04/2019    9:04 AM 12/09/2017   10:04 AM  Advanced Directives  Does Patient Have a Medical Advance Directive? Yes Yes Yes Yes No No No   Type of Estate agent of [place];Living will Healthcare Power of [place];Living will Healthcare  Power of [place];Living will Living will     Does patient want to make changes to medical advance directive?    No - Patient declined     Copy of Healthcare Power of Attorney in Chart? Yes - validated most recent copy scanned in chart (See row information) Yes - validated most recent copy scanned in chart (See row information) Yes - validated most recent copy scanned in chart (See row information)      Would patient like information on creating a medical advance directive?     Yes (MAU/Ambulatory/Procedural Areas - Information given) Yes (MAU/Ambulatory/Procedural Areas - Information given)  Yes (MAU/Ambulatory/Procedural Areas - Information given)      Data saved with a previous flowsheet row definition    Current Medications (verified) Outpatient Encounter Medications as of 02/03/2024  Medication Sig   Acetaminophen (TYLENOL ARTHRITIS PAIN PO) Take by mouth.   atorvastatin  (LIPITOR) 20 MG tablet TAKE 1 TABLET (20 MG TOTAL) BY MOUTH DAILY.   cetirizine (ZYRTEC) 10 MG tablet Take 10 mg by mouth every other day.   CLINPRO 5000 1.1 % PSTE    lisinopril -hydrochlorothiazide  (ZESTORETIC ) 20-25 MG tablet TAKE 1 TABLET EVERY DAY   Multiple Vitamins-Minerals (MULTIVITAMIN WITH MINERALS) tablet Take 1 tablet by mouth daily.   Omega-3 Fatty Acids (FISH OIL PO) Take 2 capsules by mouth daily.    atorvastatin  (LIPITOR) 20 MG tablet Take 20 mg by mouth daily. (Patient not taking: Reported on 02/03/2024)   ibuprofen  (ADVIL ) 800 MG tablet TAKE 1 TABLET BY MOUTH EVERY 8 HOURS AS NEEDED FOR MODERATE PAIN. (Patient not taking: Reported  on 02/03/2024)   mupirocin  cream (BACTROBAN ) 2 % Apply 1 Application topically 2 (two) times daily. (Patient not taking: Reported on 02/03/2024)   Pseudoephedrine-Ibuprofen  30-200 MG TABS Take 2 tablets by mouth as needed. (Patient not taking: Reported on 02/03/2024)   No facility-administered encounter medications on file as of 02/03/2024.    Allergies (verified) Patient has no  known allergies.   History: Past Medical History:  Diagnosis Date   Fibroid    Heel spur    Hyperlipidemia    Staph infection 2012   Past Surgical History:  Procedure Laterality Date   ABDOMINAL HYSTERECTOMY  1996   TAH/FIBROIDS   EYE MUSCLE SURGERY  child   L eye   HERNIA REPAIR     MYOMECTOMY ABDOMINAL APPROACH  1986   Staph infection  2012   Right thigh   Family History  Problem Relation Age of Onset   Cancer Mother        lung cancer   Diabetes Father    Hypertension Father    Diabetes Sister    Cancer Brother        ?tumors in muscles   Hyperlipidemia Sister    Hypertension Sister    Hyperlipidemia Sister    Hypertension Sister    Social History   Socioeconomic History   Marital status: Married    Spouse name: Not on file   Number of children: Not on file   Years of education: Not on file   Highest education level: Not on file  Occupational History   Not on file  Tobacco Use   Smoking status: Never   Smokeless tobacco: Never  Vaping Use   Vaping status: Never Used  Substance and Sexual Activity   Alcohol use: No   Drug use: No   Sexual activity: Yes    Birth control/protection: Surgical    Comment: 73 YEARS OLD, NO MORE THAN 5 PARTNERS  Other Topics Concern   Not on file  Social History Narrative   Not on file   Social Drivers of Health   Financial Resource Strain: Low Risk  (02/03/2024)   Overall Financial Resource Strain (CARDIA)    Difficulty of Paying Living Expenses: Not hard at all  Food Insecurity: No Food Insecurity (02/03/2024)   Hunger Vital Sign    Worried About Running Out of Food in the Last Year: Never true    Ran Out of Food in the Last Year: Never true  Transportation Needs: No Transportation Needs (02/03/2024)   PRAPARE - Administrator, Civil Service (Medical): No    Lack of Transportation (Non-Medical): No  Physical Activity: Insufficiently Active (02/03/2024)   Exercise Vital Sign    Days of Exercise per Week:  3 days    Minutes of Exercise per Session: 30 min  Stress: No Stress Concern Present (02/03/2024)   [employer] of Occupational Health - Occupational Stress Questionnaire    Feeling of Stress: Not at all  Social Connections: Moderately Integrated (02/03/2024)   Social Connection and Isolation Panel    Frequency of Communication with Friends and Family: More than three times a week    Frequency of Social Gatherings with Friends and Family: Not on file    Attends Religious Services: More than 4 times per year    Active Member of [employer] or Organizations: No    Attends Banker Meetings: Never    Marital Status: Married    Tobacco Counseling Counseling given: Not Answered    Clinical  Intake:  Pre-visit preparation completed: Yes  Pain : No/denies pain     Nutritional Risks: None Diabetes: No  Lab Results  Component Value Date   HGBA1C 6.2 (H) 02/07/2020   HGBA1C 6.3 (A) 02/07/2020   HGBA1C 6.1 (H) 02/04/2019     How often do you need to have someone help you when you read instructions, pamphlets, or other written materials from your doctor or pharmacy?: 1 - Never  Interpreter Needed?: No  Information entered by :: NAllen LPN   Activities of Daily Living     02/03/2024   10:07 AM  In your present state of health, do you have any difficulty performing the following activities:  Hearing? 0  Vision? 0  Difficulty concentrating or making decisions? 0  Walking or climbing stairs? 0  Dressing or bathing? 0  Doing errands, shopping? 0  Preparing Food and eating ? N  Using the Toilet? N  In the past six months, have you accidently leaked urine? N  Do you have problems with loss of bowel control? N  Managing your Medications? N  Managing your Finances? N  Housekeeping or managing your Housekeeping? N    Patient Care Team: Joyce Norleen BROCKS, MD as PCP - General (Family Medicine)  I have updated your Care Teams any recent Medical Services you may have  received from other providers in the past year.     Assessment:   This is a routine wellness examination for Angel Ortega.  Hearing/Vision screen Hearing Screening - Comments:: Denies hearing issues Vision Screening - Comments:: Regular eye exams,    Goals Addressed             This Visit's Progress    Patient Stated       02/03/2024, wants to lose weight       Depression Screen     02/03/2024   10:15 AM 01/28/2023    9:37 AM 02/08/2022    9:03 AM 02/07/2021    9:48 AM 02/07/2020    9:30 AM 02/04/2019    8:27 AM 12/09/2017    9:39 AM  PHQ 2/9 Scores  PHQ - 2 Score 0 0 0 0 0 0 0  PHQ- 9 Score 0 0 0        Fall Risk     02/03/2024   10:14 AM 02/25/2023    2:54 PM 01/28/2023    9:36 AM 02/08/2022    9:03 AM 02/07/2021    9:48 AM  Fall Risk   Falls in the past year? 0 0 0 0 0  Number falls in past yr: 0 0 0 0 0  Injury with Fall? 0 0 0 0 0  Risk for fall due to : Medication side effect  Medication side effect Medication side effect No Fall Risks  Follow up Falls evaluation completed;Falls prevention discussed Falls evaluation completed Falls prevention discussed;Falls evaluation completed Falls evaluation completed;Education provided;Falls prevention discussed  Falls evaluation completed      Data saved with a previous flowsheet row definition    MEDICARE RISK AT HOME:  Medicare Risk at Home Any stairs in or around the home?: Yes If so, are there any without handrails?: Yes Home free of loose throw rugs in walkways, pet beds, electrical cords, etc?: Yes Adequate lighting in your home to reduce risk of falls?: Yes Life alert?: No Use of a cane, walker or w/c?: No Grab bars in the bathroom?: Yes Shower chair or bench in shower?: No Elevated toilet seat or a  handicapped toilet?: No  TIMED UP AND GO:  Was the test performed?  No  Cognitive Function: 6CIT completed        02/03/2024   10:16 AM 01/28/2023    9:37 AM 02/08/2022    9:05 AM  6CIT Screen  What Year? 0  points 0 points 0 points  What month? 0 points 0 points 0 points  What time? 0 points 0 points 0 points  Count back from 20 0 points 0 points 0 points  Months in reverse 0 points 0 points 2 points  Repeat phrase 0 points 0 points 0 points  Total Score 0 points 0 points 2 points    Immunizations Immunization History  Administered Date(s) Administered   Fluad Quad(high Dose 65+) 02/04/2019, 03/06/2020, 04/09/2021, 03/04/2022   Fluad Trivalent(High Dose 65+) 03/03/2023   Influenza, High Dose Seasonal PF 05/11/2014, 03/09/2015, 05/28/2016, 05/01/2018   Influenza,inj,Quad PF,6+ Mos 04/07/2017   PFIZER(Purple Top)SARS-COV-2 Vaccination 07/22/2019, 08/12/2019, 04/03/2020   Pfizer Covid-19 Vaccine Bivalent Booster 47yrs & up 04/27/2021   Pfizer(Comirnaty)Fall Seasonal Vaccine 12 years and older 05/20/2022   Pneumococcal Conjugate-13 12/13/2013   Pneumococcal Polysaccharide-23 12/01/2008   Tdap 12/01/2008, 02/18/2020   Zoster Recombinant(Shingrix) 01/18/2020, 06/27/2020   Zoster, Live 12/01/2008    Screening Tests Health Maintenance  Topic Date Due   Cervical Cancer Screening (Pap smear)  12/26/2010   COVID-19 Vaccine (6 - 2024-25 season) 02/16/2023   INFLUENZA VACCINE  01/16/2024   Medicare Annual Wellness (AWV)  02/02/2025   Fecal DNA (Cologuard)  03/09/2026   DTaP/Tdap/Td (3 - Td or Tdap) 02/17/2030   DEXA SCAN  Completed   Hepatitis C Screening  Completed   Zoster Vaccines- Shingrix  Completed   HPV VACCINES  Aged Out   Meningococcal B Vaccine  Aged Out   Pneumococcal Vaccine: 50+ Years  Discontinued    Health Maintenance  Health Maintenance Due  Topic Date Due   Cervical Cancer Screening (Pap smear)  12/26/2010   COVID-19 Vaccine (6 - 2024-25 season) 02/16/2023   INFLUENZA VACCINE  01/16/2024   Health Maintenance Items Addressed: Declines covid vaccine. Pap smear not necessary.  Additional Screening:  Vision Screening: Recommended annual ophthalmology exams for  early detection of glaucoma and other disorders of the eye. Would you like a referral to an eye doctor? No    Dental Screening: Recommended annual dental exams for proper oral hygiene  Community Resource Referral / Chronic Care Management: CRR required this visit?  No   CCM required this visit?  No   Plan:    I have personally reviewed and noted the following in the patient's chart:   Medical and social history Use of alcohol, tobacco or illicit drugs  Current medications and supplements including opioid prescriptions. Patient is not currently taking opioid prescriptions. Functional ability and status Nutritional status Physical activity Advanced directives List of other physicians Hospitalizations, surgeries, and ER visits in previous 12 months Vitals Screenings to include cognitive, depression, and falls Referrals and appointments  In addition, I have reviewed and discussed with patient certain preventive protocols, quality metrics, and best practice recommendations. A written personalized care plan for preventive services as well as general preventive health recommendations were provided to patient.   Angel FORBES Dawn, LPN   1/80/7974   After Visit Summary: (Pick Up) Due to this being a telephonic visit, with patients personalized plan was offered to patient and patient has requested to Pick up at office.  Notes: Nothing significant to report at this time.

## 2024-02-17 ENCOUNTER — Telehealth: Payer: Self-pay

## 2024-02-17 MED ORDER — MUPIROCIN CALCIUM 2 % EX CREA: 1.0000 | TOPICAL_CREAM | Freq: Two times a day (BID) | CUTANEOUS | 0 refills | Status: AC

## 2024-02-17 NOTE — Telephone Encounter (Signed)
 Copied from CRM [id number]. Topic: Clinical - Medication Question >> Feb 17, 2024  8:48 AM Leonette SQUIBB wrote: Reason for CRM: Patient needs a new prescription of Bactroban  2%.  She has not had this filled in a couple of years but is out.  [facility] on [address].  CB# if [phone]

## 2024-02-17 NOTE — Addendum Note (Signed)
 Addended by: JOYCE NORLEEN BROCKS on: 02/17/2024 01:11 PM   Modules accepted: Orders

## 2024-04-01 ENCOUNTER — Ambulatory Visit: Payer: Medicare PPO | Admitting: Family Medicine

## 2024-04-05 ENCOUNTER — Ambulatory Visit: Admitting: Family Medicine

## 2024-04-05 ENCOUNTER — Encounter: Payer: Self-pay | Admitting: Family Medicine

## 2024-04-05 VITALS — BP 130/80 | HR 75 | Ht 62.0 in | Wt 211.0 lb

## 2024-04-05 DIAGNOSIS — G8929 Other chronic pain: Secondary | ICD-10-CM | POA: Diagnosis not present

## 2024-04-05 DIAGNOSIS — Z136 Encounter for screening for cardiovascular disorders: Secondary | ICD-10-CM

## 2024-04-05 DIAGNOSIS — M25562 Pain in left knee: Secondary | ICD-10-CM | POA: Diagnosis not present

## 2024-04-05 DIAGNOSIS — Z0000 Encounter for general adult medical examination without abnormal findings: Secondary | ICD-10-CM | POA: Diagnosis not present

## 2024-04-05 DIAGNOSIS — Z23 Encounter for immunization: Secondary | ICD-10-CM | POA: Diagnosis not present

## 2024-04-05 DIAGNOSIS — I10 Essential (primary) hypertension: Secondary | ICD-10-CM

## 2024-04-05 LAB — CBC WITH DIFFERENTIAL/PLATELET
Basophils Absolute: 0 x10E3/uL (ref 0.0–0.2)
Basos: 1 %
EOS (ABSOLUTE): 0.1 x10E3/uL (ref 0.0–0.4)
Eos: 3 %
Hematocrit: 40.4 % (ref 34.0–46.6)
Hemoglobin: 12.8 g/dL (ref 11.1–15.9)
Immature Grans (Abs): 0 x10E3/uL (ref 0.0–0.1)
Immature Granulocytes: 0 %
Lymphocytes Absolute: 1.2 x10E3/uL (ref 0.7–3.1)
Lymphs: 31 %
MCH: 29.6 pg (ref 26.6–33.0)
MCHC: 31.7 g/dL (ref 31.5–35.7)
MCV: 94 fL (ref 79–97)
Monocytes Absolute: 0.3 x10E3/uL (ref 0.1–0.9)
Monocytes: 9 %
Neutrophils Absolute: 2.2 x10E3/uL (ref 1.4–7.0)
Neutrophils: 56 %
Platelets: 192 x10E3/uL (ref 150–450)
RBC: 4.32 x10E6/uL (ref 3.77–5.28)
RDW: 12.8 % (ref 11.7–15.4)
WBC: 4 x10E3/uL (ref 3.4–10.8)

## 2024-04-05 LAB — LIPID PANEL
Chol/HDL Ratio: 3.3 ratio (ref 0.0–4.4)
Cholesterol, Total: 146 mg/dL (ref 100–199)
HDL: 44 mg/dL (ref >39)
LDL Chol Calc (NIH): 88 mg/dL (ref 0–99)
Triglycerides: 73 mg/dL (ref 0–149)
VLDL Cholesterol Cal: 14 mg/dL (ref 5–40)

## 2024-04-05 LAB — COMPREHENSIVE METABOLIC PANEL WITH GFR
ALT: 19 IU/L (ref 0–32)
AST: 15 IU/L (ref 0–40)
Albumin: 4.1 g/dL (ref 3.8–4.8)
Alkaline Phosphatase: 81 IU/L (ref 49–135)
BUN/Creatinine Ratio: 18 (ref 12–28)
BUN: 18 mg/dL (ref 8–27)
Bilirubin Total: 0.6 mg/dL (ref 0.0–1.2)
CO2: 27 mmol/L (ref 20–29)
Calcium: 9.2 mg/dL (ref 8.7–10.3)
Chloride: 103 mmol/L (ref 96–106)
Creatinine, Ser: 0.98 mg/dL (ref 0.57–1.00)
Globulin, Total: 3.1 g/dL (ref 1.5–4.5)
Glucose: 109 mg/dL — ABNORMAL HIGH (ref 70–99)
Potassium: 3.8 mmol/L (ref 3.5–5.2)
Sodium: 141 mmol/L (ref 134–144)
Total Protein: 7.2 g/dL (ref 6.0–8.5)
eGFR: 60 mL/min/1.73 (ref >59)

## 2024-04-05 NOTE — Progress Notes (Signed)
 Name: Angel Ortega   Date of Visit: 04/05/24   Date of last visit with me: Visit date not found   CHIEF COMPLAINT:  Chief Complaint  Patient presents with   Medicare Wellness    Wellness visit.        HPI:  Discussed the use of AI scribe software for clinical note transcription with the patient, who gave verbal consent to proceed.  History of Present Illness   Angel Ortega is a 73 year old female with arthritis who presents with knee pain.  She experiences knee pain, particularly when walking, and sometimes hears a popping sound in her knee. She wishes to avoid surgery and is seeking ways to manage her symptoms. Despite the discomfort, she continues to walk three to four times a week.  She is currently taking atorvastatin  for cholesterol management and lisinopril . Additionally, she takes a vitamin supplement, turmeric for its anti-inflammatory properties, and fish oil at a dose of 2000 mg per day.  Her past medical history includes arthritis and hyperlipidemia. She has been managing her condition with the aforementioned supplements and medications.         OBJECTIVE:       04/05/2024   10:17 AM  Depression screen PHQ 2/9  Decreased Interest 0  Down, Depressed, Hopeless 0  PHQ - 2 Score 0     BP Readings from Last 3 Encounters:  04/05/24 130/80  02/25/23 130/82  05/20/22 130/80    BP 130/80   Pulse 75   Ht 5' 2 (1.575 m)   Wt 211 lb (95.7 kg)   LMP  (LMP Unknown)   SpO2 97%   BMI 38.59 kg/m    Physical Exam          Physical Exam Constitutional:      Appearance: Normal appearance.  Neurological:     General: No focal deficit present.     Mental Status: She is alert and oriented to person, place, and time. Mental status is at baseline.     ASSESSMENT/PLAN:   Assessment & Plan Encounter for Medicare annual wellness exam  Chronic pain of left knee  Essential hypertension  Encounter for screening for cardiovascular disorders     Assessment and Plan    Knee osteoarthritis Chronic knee pain with popping sounds, likely due to age-related osteoarthritis. Prefers non-surgical management. - Order knee x-ray at [facility]. - Recommend knee compression sleeve without a hole. - Advise walking three to four times a week with compression sleeve. - Follow-up in two weeks to review x-ray results. - Consider injection if minimal pain improvement.  Hypertension Blood pressure well-controlled on current medication regimen. - Continue lisinopril . - Refill lisinopril  prescription next month. - Cmp and CBC  Hyperlipidemia Cholesterol levels well-managed with atorvastatin . - Continue atorvastatin . - Continue fish oil supplements, two capsules of 1000 mg each per day. - Lipid panel, CMP  General Health Maintenance Routine health maintenance up to date. Opted for flu vaccine, declined COVID-19 vaccine. - Administer flu vaccine. - Respect decision to decline COVID-19 vaccine.  -- Full history and exam completed today in addition to [facility] Wellness Visit. Reviewed interval concerns, chronic conditions, and preventive care needs. Physical exam performed. Counseling provided on lifestyle, screenings, vaccines, and routine health maintenance.  -  I reviewed and addressed the Medicare-specific components of today's visit, including preventive services eligibility, screening recommendations, and health maintenance items as applicable.         Stevin Bielinski A. Vita MD [facility]

## 2024-04-05 NOTE — Patient Instructions (Addendum)
 [address]  Please go here to get your xrays done  Please go to the pharmacy and ask them for a knee compression sleeve, you do not want one with a hole in it.

## 2024-04-06 ENCOUNTER — Ambulatory Visit: Payer: Self-pay | Admitting: Family Medicine

## 2024-04-12 ENCOUNTER — Ambulatory Visit
Admission: RE | Admit: 2024-04-12 | Discharge: 2024-04-12 | Disposition: A | Discharge status: Home / Self Care | Source: Ambulatory Visit | Attending: Family Medicine | Admitting: Family Medicine

## 2024-04-12 DIAGNOSIS — G8929 Other chronic pain: Secondary | ICD-10-CM

## 2024-04-12 DIAGNOSIS — M1712 Unilateral primary osteoarthritis, left knee: Secondary | ICD-10-CM | POA: Diagnosis not present

## 2024-04-26 ENCOUNTER — Encounter: Payer: Self-pay | Admitting: Family Medicine

## 2024-04-26 ENCOUNTER — Ambulatory Visit: Payer: Self-pay | Admitting: Family Medicine

## 2024-04-26 VITALS — BP 124/78 | HR 68 | Wt 212.4 lb

## 2024-04-26 DIAGNOSIS — G8929 Other chronic pain: Secondary | ICD-10-CM | POA: Diagnosis not present

## 2024-04-26 DIAGNOSIS — M25562 Pain in left knee: Secondary | ICD-10-CM

## 2024-04-26 NOTE — Progress Notes (Signed)
   Name: Angel Ortega   Date of Visit: 04/26/24   Date of last visit with me: 04/05/2024   CHIEF COMPLAINT:  Chief Complaint  Patient presents with   Follow-up    X-ray results.        HPI:  Discussed the use of AI scribe software for clinical note transcription with the patient, who gave verbal consent to proceed.  History of Present Illness   Angel Ortega is a 73 year old female with knee osteoarthritis who presents with concerns about knee pain and joint noises.  She experiences knee pain and audible joint noises, such as popping and crunching, particularly when walking or bending her knee. The pain is described as a 'throb' that occurs occasionally but does not significantly bother her most of the time. The noises are more noticeable when walking, and she sometimes worries her knee might 'go out.'  She has a history of knee osteoarthritis, with previous x-rays from 2019 showing joint space narrowing and osteophytes. Recent x-rays indicate further narrowing and increased osteophyte formation.  She manages her symptoms with a compression sleeve, which she wears during long walks or activities. The sleeve sometimes helps reduce the noise from her knee. She walks two to three times a week. During the review of symptoms, she rates her pain as a two or three out of ten on a typical day, indicating mild discomfort. No significant pain limits her daily activities, and she is not currently taking any medication for her knee symptoms.         OBJECTIVE:       04/05/2024   10:17 AM  Depression screen PHQ 2/9  Decreased Interest 0  Down, Depressed, Hopeless 0  PHQ - 2 Score 0     BP Readings from Last 3 Encounters:  04/26/24 124/78  04/05/24 130/80  02/25/23 130/82    BP 124/78   Pulse 68   Wt 212 lb 6.4 oz (96.3 kg)   LMP  (LMP Unknown)   SpO2 97%   BMI 38.85 kg/m    Physical Exam          Physical Exam Constitutional:      Appearance: Normal appearance.   Neurological:     General: No focal deficit present.     Mental Status: She is alert and oriented to person, place, and time. Mental status is at baseline.     ASSESSMENT/PLAN:   Assessment & Plan Chronic pain of left knee    Assessment and Plan    Osteoarthritis of right knee Chronic osteoarthritis with joint space narrowing and osteophyte formation. Symptoms include popping and crunching, pain 2-3/10. No functional limitations. Discussed potential for future pain and decline. Surgery only if severe pain limits activities. Injections discussed for future pain management. - Continue compression sleeve during activities. - Encouraged regular walking. - Monitor for increased pain or limitations. - Consider gel or steroid injections if pain 4-5/10. - Discuss knee replacement if pain becomes severe.         Loghan Kurtzman A. Vita MD [facility]

## 2024-04-28 ENCOUNTER — Other Ambulatory Visit: Payer: Self-pay | Admitting: Family Medicine

## 2024-04-28 DIAGNOSIS — I10 Essential (primary) hypertension: Secondary | ICD-10-CM

## 2025-02-08 ENCOUNTER — Ambulatory Visit: Payer: Self-pay

## 2025-04-11 ENCOUNTER — Encounter: Payer: Self-pay | Admitting: Family Medicine
